# Patient Record
Sex: Male | Born: 1955 | Hispanic: No | Marital: Married | Smoking: Former smoker
Health system: Southern US, Community
[De-identification: ages and names within clinical notes are randomized; demographics above are authoritative.]

## PROBLEM LIST (undated history)

## (undated) DIAGNOSIS — Z973 Presence of spectacles and contact lenses: Secondary | ICD-10-CM

## (undated) DIAGNOSIS — K602 Anal fissure, unspecified: Secondary | ICD-10-CM

## (undated) DIAGNOSIS — J309 Allergic rhinitis, unspecified: Secondary | ICD-10-CM

## (undated) DIAGNOSIS — K219 Gastro-esophageal reflux disease without esophagitis: Secondary | ICD-10-CM

## (undated) DIAGNOSIS — M1990 Unspecified osteoarthritis, unspecified site: Secondary | ICD-10-CM

## (undated) DIAGNOSIS — Z8719 Personal history of other diseases of the digestive system: Secondary | ICD-10-CM

---

## 1995-10-16 HISTORY — PX: KNEE ARTHROSCOPY: SHX127

## 2005-07-03 ENCOUNTER — Ambulatory Visit: Payer: Self-pay

## 2006-02-04 ENCOUNTER — Ambulatory Visit: Payer: Self-pay | Admitting: Gastroenterology

## 2006-02-15 ENCOUNTER — Encounter (INDEPENDENT_AMBULATORY_CARE_PROVIDER_SITE_OTHER): Payer: Self-pay | Admitting: *Deleted

## 2006-02-15 ENCOUNTER — Ambulatory Visit: Payer: Self-pay | Admitting: Gastroenterology

## 2008-11-18 ENCOUNTER — Encounter: Payer: Self-pay | Admitting: Infectious Diseases

## 2011-01-03 ENCOUNTER — Other Ambulatory Visit: Payer: Self-pay | Admitting: Dermatology

## 2014-05-11 ENCOUNTER — Other Ambulatory Visit: Payer: Self-pay | Admitting: Dermatology

## 2015-06-30 ENCOUNTER — Inpatient Hospital Stay (HOSPITAL_COMMUNITY)
Admission: EM | Admit: 2015-06-30 | Discharge: 2015-07-08 | DRG: 854 | Disposition: A | Discharge status: Home / Self Care | Payer: BLUE CROSS/BLUE SHIELD | Attending: Surgery | Admitting: Surgery

## 2015-06-30 ENCOUNTER — Encounter (HOSPITAL_COMMUNITY): Payer: Self-pay | Admitting: Emergency Medicine

## 2015-06-30 ENCOUNTER — Observation Stay (HOSPITAL_COMMUNITY): Payer: BLUE CROSS/BLUE SHIELD

## 2015-06-30 ENCOUNTER — Ambulatory Visit (INDEPENDENT_AMBULATORY_CARE_PROVIDER_SITE_OTHER): Payer: BLUE CROSS/BLUE SHIELD | Admitting: Family Medicine

## 2015-06-30 VITALS — BP 158/80 | HR 113 | Temp 100.5°F | Resp 18 | Ht 69.5 in | Wt 191.0 lb

## 2015-06-30 DIAGNOSIS — Z791 Long term (current) use of non-steroidal anti-inflammatories (NSAID): Secondary | ICD-10-CM

## 2015-06-30 DIAGNOSIS — K611 Rectal abscess: Secondary | ICD-10-CM

## 2015-06-30 DIAGNOSIS — N289 Disorder of kidney and ureter, unspecified: Secondary | ICD-10-CM | POA: Diagnosis present

## 2015-06-30 DIAGNOSIS — R52 Pain, unspecified: Secondary | ICD-10-CM | POA: Diagnosis not present

## 2015-06-30 DIAGNOSIS — A419 Sepsis, unspecified organism: Principal | ICD-10-CM | POA: Diagnosis present

## 2015-06-30 DIAGNOSIS — Z23 Encounter for immunization: Secondary | ICD-10-CM

## 2015-06-30 DIAGNOSIS — Z8249 Family history of ischemic heart disease and other diseases of the circulatory system: Secondary | ICD-10-CM

## 2015-06-30 DIAGNOSIS — L03317 Cellulitis of buttock: Secondary | ICD-10-CM | POA: Diagnosis present

## 2015-06-30 DIAGNOSIS — Z7982 Long term (current) use of aspirin: Secondary | ICD-10-CM

## 2015-06-30 DIAGNOSIS — Z8719 Personal history of other diseases of the digestive system: Secondary | ICD-10-CM

## 2015-06-30 DIAGNOSIS — L0231 Cutaneous abscess of buttock: Secondary | ICD-10-CM

## 2015-06-30 DIAGNOSIS — Z79899 Other long term (current) drug therapy: Secondary | ICD-10-CM

## 2015-06-30 HISTORY — DX: Personal history of other diseases of the digestive system: Z87.19

## 2015-06-30 LAB — CBC WITH DIFFERENTIAL/PLATELET
BASOS ABS: 0 10*3/uL (ref 0.0–0.1)
BASOS PCT: 0 %
EOS PCT: 0 %
Eosinophils Absolute: 0.1 10*3/uL (ref 0.0–0.7)
HCT: 41 % (ref 39.0–52.0)
Hemoglobin: 15 g/dL (ref 13.0–17.0)
Lymphocytes Relative: 4 %
Lymphs Abs: 1 10*3/uL (ref 0.7–4.0)
MCH: 30.7 pg (ref 26.0–34.0)
MCHC: 36.6 g/dL — ABNORMAL HIGH (ref 30.0–36.0)
MCV: 84 fL (ref 78.0–100.0)
MONO ABS: 0.9 10*3/uL (ref 0.1–1.0)
Monocytes Relative: 4 %
NEUTROS ABS: 21.7 10*3/uL — AB (ref 1.7–7.7)
Neutrophils Relative %: 92 %
PLATELETS: 494 10*3/uL — AB (ref 150–400)
RBC: 4.88 MIL/uL (ref 4.22–5.81)
RDW: 15.4 % (ref 11.5–15.5)
WBC: 23.7 10*3/uL — AB (ref 4.0–10.5)

## 2015-06-30 LAB — BASIC METABOLIC PANEL
Anion gap: 11 (ref 5–15)
BUN: 16 mg/dL (ref 6–20)
CALCIUM: 8.5 mg/dL — AB (ref 8.9–10.3)
CHLORIDE: 102 mmol/L (ref 101–111)
CO2: 20 mmol/L — AB (ref 22–32)
CREATININE: 1.07 mg/dL (ref 0.61–1.24)
GFR calc Af Amer: >60 mL/min (ref >60)
GFR calc non Af Amer: >60 mL/min (ref >60)
GLUCOSE: 99 mg/dL (ref 65–99)
Potassium: 3.1 mmol/L — ABNORMAL LOW (ref 3.5–5.1)
Sodium: 133 mmol/L — ABNORMAL LOW (ref 135–145)

## 2015-06-30 LAB — I-STAT CG4 LACTIC ACID, ED
Lactic Acid, Venous: 1.23 mmol/L (ref 0.5–2.0)
Lactic Acid, Venous: 1.43 mmol/L (ref 0.5–2.0)

## 2015-06-30 MED ORDER — ACETAMINOPHEN 650 MG RE SUPP: 650.0000 mg | Freq: Four times a day (QID) | RECTAL | Status: DC | PRN

## 2015-06-30 MED ORDER — ONDANSETRON 4 MG PO TBDP
4.0000 mg | ORAL_TABLET | Freq: Four times a day (QID) | ORAL | Status: DC | PRN
Start: 2015-06-30 — End: 2015-07-08

## 2015-06-30 MED ORDER — ONDANSETRON HCL 4 MG/2ML IJ SOLN
4.0000 mg | Freq: Four times a day (QID) | INTRAMUSCULAR | Status: DC | PRN
  Administered 2015-07-01: 4 mg via INTRAVENOUS
  Filled 2015-06-30: qty 2

## 2015-06-30 MED ORDER — VANCOMYCIN HCL IN DEXTROSE 1-5 GM/200ML-% IV SOLN
1000.0000 mg | Freq: Two times a day (BID) | INTRAVENOUS | Status: DC
  Administered 2015-07-01 – 2015-07-02 (×3): 1000 mg via INTRAVENOUS
  Filled 2015-06-30 (×4): qty 200

## 2015-06-30 MED ORDER — KCL IN DEXTROSE-NACL 20-5-0.45 MEQ/L-%-% IV SOLN
INTRAVENOUS | Status: DC
  Administered 2015-06-30: 21:00:00 via INTRAVENOUS
  Filled 2015-06-30 (×2): qty 1000

## 2015-06-30 MED ORDER — ACETAMINOPHEN 325 MG PO TABS
650.0000 mg | ORAL_TABLET | Freq: Four times a day (QID) | ORAL | Status: DC | PRN
  Administered 2015-06-30: 650 mg via ORAL
  Filled 2015-06-30: qty 2

## 2015-06-30 MED ORDER — HYDROCODONE-ACETAMINOPHEN 5-325 MG PO TABS
1.0000 | ORAL_TABLET | ORAL | Status: DC | PRN
  Administered 2015-07-01 – 2015-07-02 (×2): 1 via ORAL
  Administered 2015-07-02: 2 via ORAL
  Administered 2015-07-03: 1 via ORAL
  Administered 2015-07-03 – 2015-07-08 (×13): 2 via ORAL
  Filled 2015-06-30 (×4): qty 2
  Filled 2015-06-30: qty 1
  Filled 2015-06-30 (×6): qty 2
  Filled 2015-06-30 (×2): qty 1
  Filled 2015-06-30 (×4): qty 2

## 2015-06-30 MED ORDER — INFLUENZA VAC SPLIT QUAD 0.5 ML IM SUSY
0.5000 mL | PREFILLED_SYRINGE | INTRAMUSCULAR | Status: AC
  Administered 2015-07-02: 0.5 mL via INTRAMUSCULAR
  Filled 2015-06-30 (×2): qty 0.5

## 2015-06-30 MED ORDER — IOHEXOL 300 MG/ML  SOLN
100.0000 mL | Freq: Once | INTRAMUSCULAR | Status: AC | PRN
  Administered 2015-06-30: 100 mL via INTRAVENOUS

## 2015-06-30 MED ORDER — HYDROMORPHONE HCL 1 MG/ML IJ SOLN
1.0000 mg | INTRAMUSCULAR | Status: DC | PRN
  Administered 2015-07-01: 1 mg via INTRAVENOUS
  Filled 2015-06-30: qty 1

## 2015-06-30 MED ORDER — VANCOMYCIN HCL IN DEXTROSE 1-5 GM/200ML-% IV SOLN
1000.0000 mg | INTRAVENOUS | Status: AC
  Administered 2015-06-30: 1000 mg via INTRAVENOUS
  Filled 2015-06-30: qty 200

## 2015-06-30 NOTE — Progress Notes (Signed)
ANTIBIOTIC CONSULT NOTE - INITIAL  Pharmacy Consult for vancomycin Indication: perirectal abscess  No Known Allergies  Patient Measurements:    Vital Signs: Temp: 99.4 F (37.4 C) (09/15 1714) Temp Source: Oral (09/15 1714) BP: 163/81 mmHg (09/15 1755) Pulse Rate: 111 (09/15 1755) Intake/Output from previous day:   Intake/Output from this shift:    Labs:  Recent Labs  06/30/15 1735 06/30/15 1828  WBC 23.7*  --   HGB 15.0  --   PLT 494*  --   CREATININE  --  1.07   Estimated Creatinine Clearance: 80.8 mL/min (by C-G formula based on Cr of 1.07). No results for input(s): VANCOTROUGH, VANCOPEAK, VANCORANDOM, GENTTROUGH, GENTPEAK, GENTRANDOM, TOBRATROUGH, TOBRAPEAK, TOBRARND, AMIKACINPEAK, AMIKACINTROU, AMIKACIN in the last 72 hours.   Microbiology: No results found for this or any previous visit (from the past 720 hour(s)).  Medical History: Past Medical History  Diagnosis Date  . Allergy     Medications:  Scheduled:  Infusions:   Assessment: 59 yo presents to ER with CC of perirectal abscess size of fist and may need a surgical consult. To start vancomycin per pharmacy consult. Fever, WBC elevated, SCr 1.07 with est CrCl 81  Goal of Therapy:  Vancomycin trough level 15-20 mcg/ml  Plan:  Vancomycin 1g IV x 1 in ER then start 1g IV q12 thereafter   Hessie Knows, PharmD, BCPS Pager [phone] 06/30/2015 7:57 PM

## 2015-06-30 NOTE — ED Notes (Signed)
Dr Blima Singer, surgery, called and spoke with Dr Adriana Simas.

## 2015-06-30 NOTE — ED Notes (Signed)
Patient c/o abscess to left buttock since Monday. No draining currently, pain 3/10. Denies fever or chills at home.

## 2015-06-30 NOTE — ED Provider Notes (Signed)
CSN: [id number]     Arrival date & time 06/30/15  1646 History   First MD Initiated Contact with Patient 06/30/15 1751     Chief Complaint  Patient presents with  . Abscess     (Consider location/radiation/quality/duration/timing/severity/associated sxs/prior Treatment) HPI....Marland Kitchen Abscess on left side of buttocks for several days. Low-grade fever. Patient initially evaluated by urgent care center.  Primary care physician spoke to Dr on call for [facility]. Patient is eating and ambulatory.  Past Medical History  Diagnosis Date  . Allergy    Past Surgical History  Procedure Laterality Date  . Knee arthroscopy Left    Family History  Problem Relation Age of Onset  . Hypertension Mother   . Hypertension Father   . Heart disease Father    Social History  Substance Use Topics  . Smoking status: Never Smoker   . Smokeless tobacco: None  . Alcohol Use: 0.0 oz/week    0 Standard drinks or equivalent per week    Review of Systems  All other systems reviewed and are negative.     Allergies  Review of patient's allergies indicates no known allergies.  Home Medications   Prior to Admission medications   Medication Sig Start Date End Date Taking? Authorizing Provider  aspirin 81 MG tablet Take 81 mg by mouth daily.   Yes Historical Provider, MD  esomeprazole (NEXIUM) 10 MG packet Take 10 mg by mouth daily as needed (acid reflux).    Yes Historical Provider, MD  ibuprofen (ADVIL,MOTRIN) 200 MG tablet Take 400 mg by mouth every 6 (six) hours as needed for moderate pain.   Yes Historical Provider, MD  meloxicam (MOBIC) 15 MG tablet Take 15 mg by mouth daily.   Yes Historical Provider, MD  pseudoephedrine (SUDAFED) 30 MG tablet Take 60 mg by mouth every 4 (four) hours as needed for congestion.   Yes Historical Provider, MD   BP 163/81 mmHg  Pulse 111  Temp(Src) 99.4 F (37.4 C) (Oral)  Resp 16  SpO2 98% Physical Exam  Constitutional: He is oriented to person,  place, and time. He appears well-developed and well-nourished.  HENT:  Head: Normocephalic and atraumatic.  Eyes: Conjunctivae and EOM are normal. Pupils are equal, round, and reactive to light.  Neck: Normal range of motion. Neck supple.  Cardiovascular: Normal rate and regular rhythm.   Pulmonary/Chest: Effort normal and breath sounds normal.  Abdominal: Soft. Bowel sounds are normal.  Genitourinary:  History of erythema on left medial buttocks approximately 10-15 cm in diameter. Area of induration medially. No obvious pus pocket.  Musculoskeletal: Normal range of motion.  Neurological: He is alert and oriented to person, place, and time.  Skin: Skin is warm and dry.  Psychiatric: He has a normal mood and affect. His behavior is normal.  Nursing note and vitals reviewed.   ED Course  Procedures (including critical care time) Labs Review Labs Reviewed  CBC WITH DIFFERENTIAL/PLATELET - Abnormal; Notable for the following:    WBC 23.7 (*)    MCHC 36.6 (*)    Platelets 494 (*)    Neutro Abs 21.7 (*)    All other components within normal limits  BASIC METABOLIC PANEL  I-STAT CG4 LACTIC ACID, ED    Imaging Review No results found. I have personally reviewed and evaluated these images and lab results as part of my medical decision-making.   EKG Interpretation None      MDM   Final diagnoses:  Perirectal abscess  Patient is febrile but nontoxic appearing. Discussed with general surgeon. He will evaluate patient. IV antibiotic started.    Donnetta Hutching, MD 07/03/15 [phone]

## 2015-06-30 NOTE — Progress Notes (Signed)
Swelling of  buttock  Subjective:  Patient ID: Joseph Villarreal, male    DOB: 07-31-1956  Age: 59 y.o. MRN: [id number]   patient is here for several things. He's been having headache congestion since  Doing yard work over the weekend. He had a mask on. He is been very congested since then and taking Sudafed.   His blood pressure is high , usually runs around 140/80 or less at home. No other cardiovascular symptoms such as dizziness or chest pains or shortness of breath    his biggest concern is he has a swelling of his left buttock which seems to be getting bigger. He wonders whether he is getting any on the right side now. He was at the beach on vacation last week. He did have a rectal fissure some years ago. Has not been running fevers.   review of systems HEENT was negative. GI  No change in bowels. GU unremarkable. Musculoskeletal, hurts in his buttock is noted. Dermatologic feels hot on the skin on his buttock. .  Anxious.     works a Office manager. Feels the need to be able to get back to work tomorrow. I told him he might not be able to be. He is married. He lives out of town but his wife is going to come get him in case he cannot drive home from the hospital.    Objective:     Generally a well-developed well-nourished very suntanned man.eyes normal. TMs normal. Throat clear. Chest clear. Heart regular without murmurs. No cervical nodes.  Abdomen unremarkable.His left buttock has a swelling about the size of my fist from the gluteal crease around the anal area on over to the mid buttock. It is tight indurated without fluctuance. It is not coming to a head. He is very tender just to the left of the anus.  Assessment & Plan:   Assessment:   left gluteal abscess   blood pressure elevation secondary to pseudoephedrine  Allergic rhinitis and congestion  Plan:  ad a seem to emergency room  Patient Instructions   Take an anti-histamine such as Allegra or Claritin or Zyrtec ( loratadine,  fexofenadine, or cetirizine) for your congestion.   Do not take anymore pseudoephedrine. I think it is causing the elevation today of the blood pressure.  Monitor your blood pressure at home. If it continues to run greater than 140/90 please return or see your primary care doctor to get reassessed   Go directly over 2 [facility] emergency room. I have spoken to the charge nurse. I also spoke to Dr. Gaynelle Adu , a surgeon from [facility] surgery, who his going to try and let his associate who is on-call for tonight know about this. The nurse said to let you know that things are busy and there may be a wait. However this must be done tonight, so head on over there. Do not eat anything in case they need to give some an aesthetic to drain this.            HOPPER,DAVID, MD 06/30/2015

## 2015-06-30 NOTE — ED Notes (Signed)
Pt being sent over by Dr Al[facility] has perirectal abscess size of fist, may need surgery consult.

## 2015-06-30 NOTE — Patient Instructions (Addendum)
Take an anti-histamine such as Allegra or Claritin or Zyrtec ( loratadine, fexofenadine, or cetirizine) for your congestion.   Do not take anymore pseudoephedrine. I think it is causing the elevation today of the blood pressure.  Monitor your blood pressure at home. If it continues to run greater than 140/90 please return or see your primary care doctor to get reassessed   Go directly over 2 [facility] emergency room. I have spoken to the charge nurse. I also spoke to Dr. Gaynelle Adu , a surgeon from [facility] surgery, who his going to try and let his associate who is on-call for tonight know about this. The nurse said to let you know that things are busy and there may be a wait. However this must be done tonight, so head on over there. Do not eat anything in case they need to give some an aesthetic to drain this.

## 2015-06-30 NOTE — H&P (Signed)
Joseph Villarreal is an 59 y.o. male.    General Surgery [facility]  Chief Complaint: cellulitis left buttock, rule out abscess  HPI: patient is a 59 yo WM with 4 day hx of pain in left buttock.  No drainage.  No pain with normal BM's.  Low grade fever with sweats, chills.  Seen [facility] at [facility] and referred to ER for evaluation.  WBC 23K.  No scans yet performed.  Past Medical History  Diagnosis Date  . Allergy     Past Surgical History  Procedure Laterality Date  . Knee arthroscopy Left     Family History  Problem Relation Age of Onset  . Hypertension Mother   . Hypertension Father   . Heart disease Father    Social History:  reports that he has never smoked. He does not have any smokeless tobacco history on file. He reports that he drinks alcohol. He reports that he does not use illicit drugs.  Allergies: No Known Allergies   (Not in a hospital admission)  Results for orders placed or performed during the hospital encounter of 06/30/15 (from the past 48 hour(s))  CBC with Differential     Status: Abnormal   Collection Time: 06/30/15  5:35 PM  Result Value Ref Range   WBC 23.7 (H) 4.0 - 10.5 K/uL   RBC 4.88 4.22 - 5.81 MIL/uL   Hemoglobin 15.0 13.0 - 17.0 g/dL   HCT 41.0 39.0 - 52.0 %   MCV 84.0 78.0 - 100.0 fL   MCH 30.7 26.0 - 34.0 pg   MCHC 36.6 (H) 30.0 - 36.0 g/dL   RDW 15.4 11.5 - 15.5 %   Platelets 494 (H) 150 - 400 K/uL   Neutrophils Relative % 92 %   Neutro Abs 21.7 (H) 1.7 - 7.7 K/uL   Lymphocytes Relative 4 %   Lymphs Abs 1.0 0.7 - 4.0 K/uL   Monocytes Relative 4 %   Monocytes Absolute 0.9 0.1 - 1.0 K/uL   Eosinophils Relative 0 %   Eosinophils Absolute 0.1 0.0 - 0.7 K/uL   Basophils Relative 0 %   Basophils Absolute 0.0 0.0 - 0.1 K/uL  I-Stat CG4 Lactic Acid, ED (Not at [facility])     Status: None   Collection Time: 06/30/15  5:42 PM  Result Value Ref Range   Lactic Acid, Venous 1.23 0.5 - 2.0 mmol/L  Basic metabolic  panel     Status: Abnormal   Collection Time: 06/30/15  6:28 PM  Result Value Ref Range   Sodium 133 (L) 135 - 145 mmol/L   Potassium 3.1 (L) 3.5 - 5.1 mmol/L   Chloride 102 101 - 111 mmol/L   CO2 20 (L) 22 - 32 mmol/L   Glucose, Bld 99 65 - 99 mg/dL   BUN 16 6 - 20 mg/dL   Creatinine, Ser 1.07 0.61 - 1.24 mg/dL   Calcium 8.5 (L) 8.9 - 10.3 mg/dL   GFR calc non Af Amer >60 >60 mL/min   GFR calc Af Amer >60 >60 mL/min    Comment: (NOTE) The eGFR has been calculated using the CKD EPI equation. This calculation has not been validated in all clinical situations. eGFR's persistently <60 mL/min signify possible Chronic Kidney Disease.    Anion gap 11 5 - 15   No results found.  Review of Systems  Constitutional: Positive for fever, chills and diaphoresis.  HENT: Negative.   Eyes: Negative.   Respiratory: Negative.  Cardiovascular: Negative.   Gastrointestinal: Negative.   Genitourinary: Negative.   Musculoskeletal: Negative.   Skin:       Pain left buttock  Neurological: Negative.   Endo/Heme/Allergies: Negative.   Psychiatric/Behavioral: Negative.     Blood pressure 163/81, pulse 111, temperature 99.4 F (37.4 C), temperature source Oral, resp. rate 16, SpO2 98 %. Physical Exam  Constitutional: He is oriented to person, place, and time. He appears well-developed and well-nourished. No distress.  HENT:  Head: Normocephalic and atraumatic.  Right Ear: External ear normal.  Left Ear: External ear normal.  Eyes: Conjunctivae are normal. Pupils are equal, round, and reactive to light. No scleral icterus.  Neck: Normal range of motion. Neck supple. No tracheal deviation present. No thyromegaly present.  Cardiovascular: Normal rate, regular rhythm and normal heart sounds.   No murmur heard. Respiratory: Effort normal and breath sounds normal. No respiratory distress. He has no wheezes.  GI: Soft. Bowel sounds are normal. He exhibits no distension. There is no tenderness.   Genitourinary:  Left buttock with induration and erythema over approx 15 cm diameter; no obvious fluctuence; no drainage; right buttock with mild tenderness; rectal deferred as performed by primary MD earlier with no acute findings  Musculoskeletal: Normal range of motion. He exhibits no edema or tenderness.  Neurological: He is alert and oriented to person, place, and time.  Skin: Skin is warm. He is diaphoretic. There is erythema (left medial buttock).  Psychiatric: He has a normal mood and affect. His behavior is normal.     Assessment/Plan Cellulitis left buttock, rule out occult abscess  Admit to surgical service  IV Vanco started in ER, will add Zosyn to cover possible rectal source  CT pelvis to rule out deep space abscess which is not evident on physical exam  Discussed plans with patient and wife at bedside.  They agree to proceed.  May require operative intervention if abscess identified.  Earnstine Regal, MD, [facility] Office: [place] 06/30/2015, 8:12 PM

## 2015-07-01 ENCOUNTER — Encounter (HOSPITAL_COMMUNITY): Payer: Self-pay

## 2015-07-01 ENCOUNTER — Observation Stay (HOSPITAL_COMMUNITY): Payer: BLUE CROSS/BLUE SHIELD | Admitting: Certified Registered Nurse Anesthetist

## 2015-07-01 ENCOUNTER — Encounter (HOSPITAL_COMMUNITY): Admission: EM | Disposition: A | Discharge status: Home / Self Care | Payer: Self-pay | Source: Home / Self Care

## 2015-07-01 DIAGNOSIS — K611 Rectal abscess: Secondary | ICD-10-CM | POA: Diagnosis present

## 2015-07-01 DIAGNOSIS — A419 Sepsis, unspecified organism: Secondary | ICD-10-CM | POA: Diagnosis present

## 2015-07-01 DIAGNOSIS — Z23 Encounter for immunization: Secondary | ICD-10-CM | POA: Diagnosis not present

## 2015-07-01 DIAGNOSIS — Z8249 Family history of ischemic heart disease and other diseases of the circulatory system: Secondary | ICD-10-CM | POA: Diagnosis not present

## 2015-07-01 DIAGNOSIS — N289 Disorder of kidney and ureter, unspecified: Secondary | ICD-10-CM | POA: Diagnosis present

## 2015-07-01 DIAGNOSIS — Z79899 Other long term (current) drug therapy: Secondary | ICD-10-CM | POA: Diagnosis not present

## 2015-07-01 DIAGNOSIS — L03317 Cellulitis of buttock: Secondary | ICD-10-CM | POA: Diagnosis present

## 2015-07-01 DIAGNOSIS — R52 Pain, unspecified: Secondary | ICD-10-CM | POA: Diagnosis present

## 2015-07-01 DIAGNOSIS — Z7982 Long term (current) use of aspirin: Secondary | ICD-10-CM | POA: Diagnosis not present

## 2015-07-01 DIAGNOSIS — Z791 Long term (current) use of non-steroidal anti-inflammatories (NSAID): Secondary | ICD-10-CM | POA: Diagnosis not present

## 2015-07-01 HISTORY — PX: INCISION AND DRAINAGE ABSCESS: SHX5864

## 2015-07-01 LAB — CBC
HCT: 39.4 % (ref 39.0–52.0)
HEMOGLOBIN: 13.6 g/dL (ref 13.0–17.0)
MCH: 30.4 pg (ref 26.0–34.0)
MCHC: 34.5 g/dL (ref 30.0–36.0)
MCV: 87.9 fL (ref 78.0–100.0)
Platelets: 300 10*3/uL (ref 150–400)
RBC: 4.48 MIL/uL (ref 4.22–5.81)
RDW: 14.8 % (ref 11.5–15.5)
WBC: 27.2 10*3/uL — ABNORMAL HIGH (ref 4.0–10.5)

## 2015-07-01 LAB — CREATININE, SERUM
Creatinine, Ser: 1.29 mg/dL — ABNORMAL HIGH (ref 0.61–1.24)
GFR calc non Af Amer: 59 mL/min — ABNORMAL LOW (ref >60)

## 2015-07-01 LAB — SURGICAL PCR SCREEN
MRSA, PCR: NEGATIVE
Staphylococcus aureus: NEGATIVE

## 2015-07-01 SURGERY — INCISION AND DRAINAGE, ABSCESS
Anesthesia: General | Site: Rectum

## 2015-07-01 MED ORDER — KCL IN DEXTROSE-NACL 20-5-0.45 MEQ/L-%-% IV SOLN
INTRAVENOUS | Status: DC
  Administered 2015-07-01 – 2015-07-02 (×2): via INTRAVENOUS
  Filled 2015-07-01 (×4): qty 1000

## 2015-07-01 MED ORDER — LIDOCAINE HCL (CARDIAC) 20 MG/ML IV SOLN
INTRAVENOUS | Status: AC
Start: 2015-07-01 — End: 2015-07-01
  Filled 2015-07-01: qty 5

## 2015-07-01 MED ORDER — HYDROGEN PEROXIDE 3 % EX SOLN
CUTANEOUS | Status: AC
  Filled 2015-07-01: qty 473

## 2015-07-01 MED ORDER — PROMETHAZINE HCL 25 MG/ML IJ SOLN: 6.2500 mg | INTRAMUSCULAR | Status: DC | PRN

## 2015-07-01 MED ORDER — FENTANYL CITRATE (PF) 100 MCG/2ML IJ SOLN
INTRAMUSCULAR | Status: DC | PRN
  Administered 2015-07-01: 100 ug via INTRAVENOUS
  Administered 2015-07-01: 50 ug via INTRAVENOUS

## 2015-07-01 MED ORDER — PANTOPRAZOLE SODIUM 40 MG PO PACK
40.0000 mg | PACK | Freq: Every day | ORAL | Status: DC | PRN
  Filled 2015-07-01: qty 20

## 2015-07-01 MED ORDER — PIPERACILLIN-TAZOBACTAM 3.375 G IVPB
INTRAVENOUS | Status: AC
  Filled 2015-07-01: qty 50

## 2015-07-01 MED ORDER — LIDOCAINE HCL (CARDIAC) 20 MG/ML IV SOLN
INTRAVENOUS | Status: DC | PRN
  Administered 2015-07-01: 100 mg via INTRAVENOUS

## 2015-07-01 MED ORDER — LACTATED RINGERS IV SOLN: INTRAVENOUS | Status: AC

## 2015-07-01 MED ORDER — ONDANSETRON HCL 4 MG/2ML IJ SOLN
INTRAMUSCULAR | Status: DC | PRN
  Administered 2015-07-01: 4 mg via INTRAVENOUS

## 2015-07-01 MED ORDER — ONDANSETRON HCL 4 MG/2ML IJ SOLN
INTRAMUSCULAR | Status: AC
  Filled 2015-07-01: qty 2

## 2015-07-01 MED ORDER — PROPOFOL 10 MG/ML IV BOLUS
INTRAVENOUS | Status: DC | PRN
  Administered 2015-07-01: 50 mg via INTRAVENOUS
  Administered 2015-07-01: 150 mg via INTRAVENOUS

## 2015-07-01 MED ORDER — ARTIFICIAL TEARS OP OINT
TOPICAL_OINTMENT | OPHTHALMIC | Status: AC
  Filled 2015-07-01: qty 3.5

## 2015-07-01 MED ORDER — CHLORHEXIDINE GLUCONATE 0.12 % MT SOLN: 15.0000 mL | Freq: Two times a day (BID) | OROMUCOSAL | Status: DC

## 2015-07-01 MED ORDER — LACTATED RINGERS IV SOLN
INTRAVENOUS | Status: DC
  Administered 2015-07-01: 1000 mL via INTRAVENOUS

## 2015-07-01 MED ORDER — PIPERACILLIN-TAZOBACTAM 3.375 G IVPB
3.3750 g | Freq: Three times a day (TID) | INTRAVENOUS | Status: DC
  Administered 2015-07-01 – 2015-07-07 (×18): 3.375 g via INTRAVENOUS
  Filled 2015-07-01 (×19): qty 50

## 2015-07-01 MED ORDER — DEXAMETHASONE SODIUM PHOSPHATE 10 MG/ML IJ SOLN
INTRAMUSCULAR | Status: DC | PRN
  Administered 2015-07-01: 10 mg via INTRAVENOUS

## 2015-07-01 MED ORDER — BUPIVACAINE-EPINEPHRINE (PF) 0.25% -1:200000 IJ SOLN
INTRAMUSCULAR | Status: AC
  Filled 2015-07-01: qty 30

## 2015-07-01 MED ORDER — PROPOFOL 10 MG/ML IV BOLUS
INTRAVENOUS | Status: AC
  Filled 2015-07-01: qty 20

## 2015-07-01 MED ORDER — PHENYLEPHRINE HCL 10 MG/ML IJ SOLN
INTRAMUSCULAR | Status: DC | PRN
  Administered 2015-07-01: 80 ug via INTRAVENOUS

## 2015-07-01 MED ORDER — FENTANYL CITRATE (PF) 100 MCG/2ML IJ SOLN
INTRAMUSCULAR | Status: AC
  Filled 2015-07-01: qty 4

## 2015-07-01 MED ORDER — MIDAZOLAM HCL 5 MG/5ML IJ SOLN
INTRAMUSCULAR | Status: DC | PRN
  Administered 2015-07-01: 2 mg via INTRAVENOUS

## 2015-07-01 MED ORDER — HEPARIN SODIUM (PORCINE) 5000 UNIT/ML IJ SOLN
5000.0000 [IU] | Freq: Three times a day (TID) | INTRAMUSCULAR | Status: DC
  Administered 2015-07-01 – 2015-07-08 (×21): 5000 [IU] via SUBCUTANEOUS
  Filled 2015-07-01 (×24): qty 1

## 2015-07-01 MED ORDER — SUCCINYLCHOLINE CHLORIDE 20 MG/ML IJ SOLN
INTRAMUSCULAR | Status: DC | PRN
  Administered 2015-07-01: 100 mg via INTRAVENOUS

## 2015-07-01 MED ORDER — MIDAZOLAM HCL 2 MG/2ML IJ SOLN
INTRAMUSCULAR | Status: AC
  Filled 2015-07-01: qty 4

## 2015-07-01 MED ORDER — CETYLPYRIDINIUM CHLORIDE 0.05 % MT LIQD: 7.0000 mL | Freq: Two times a day (BID) | OROMUCOSAL | Status: DC

## 2015-07-01 MED ORDER — ESOMEPRAZOLE MAGNESIUM 10 MG PO PACK: 10.0000 mg | PACK | Freq: Every day | ORAL | Status: DC | PRN

## 2015-07-01 MED ORDER — DEXAMETHASONE SODIUM PHOSPHATE 10 MG/ML IJ SOLN
INTRAMUSCULAR | Status: AC
  Filled 2015-07-01: qty 1

## 2015-07-01 MED ORDER — 0.9 % SODIUM CHLORIDE (POUR BTL) OPTIME
TOPICAL | Status: DC | PRN
  Administered 2015-07-01: 1000 mL

## 2015-07-01 MED ORDER — FENTANYL CITRATE (PF) 100 MCG/2ML IJ SOLN: 25.0000 ug | INTRAMUSCULAR | Status: DC | PRN

## 2015-07-01 SURGICAL SUPPLY — 29 items
BLADE SURG 15 STRL LF DISP TIS (BLADE) ×1 IMPLANT
BLADE SURG 15 STRL SS (BLADE) ×2
BNDG GAUZE ELAST 4 BULKY (GAUZE/BANDAGES/DRESSINGS) IMPLANT
COVER SURGICAL LIGHT HANDLE (MISCELLANEOUS) ×3 IMPLANT
DECANTER SPIKE VIAL GLASS SM (MISCELLANEOUS) IMPLANT
DRAPE LAPAROSCOPIC ABDOMINAL (DRAPES) ×3 IMPLANT
DRSG PAD ABDOMINAL 8X10 ST (GAUZE/BANDAGES/DRESSINGS) IMPLANT
ELECT PENCIL ROCKER SW 15FT (MISCELLANEOUS) ×3 IMPLANT
ELECT REM PT RETURN 9FT ADLT (ELECTROSURGICAL) ×3
ELECTRODE REM PT RTRN 9FT ADLT (ELECTROSURGICAL) ×1 IMPLANT
GAUZE SPONGE 4X4 12PLY STRL (GAUZE/BANDAGES/DRESSINGS) IMPLANT
GLOVE BIO SURGEON STRL SZ7.5 (GLOVE) ×3 IMPLANT
GOWN SPEC L4 XLG W/TWL (GOWN DISPOSABLE) ×3 IMPLANT
GOWN STRL REUS W/TWL LRG LVL3 (GOWN DISPOSABLE) ×6 IMPLANT
GOWN STRL REUS W/TWL XL LVL3 (GOWN DISPOSABLE) ×9 IMPLANT
KIT BASIN OR (CUSTOM PROCEDURE TRAY) ×3 IMPLANT
NEEDLE HYPO 25X1 1.5 SAFETY (NEEDLE) IMPLANT
NS IRRIG 1000ML POUR BTL (IV SOLUTION) ×3 IMPLANT
SPONGE LAP 18X18 X RAY DECT (DISPOSABLE) ×3 IMPLANT
SUT MNCRL AB 4-0 PS2 18 (SUTURE) IMPLANT
SUT VIC AB 3-0 SH 27 (SUTURE)
SUT VIC AB 3-0 SH 27XBRD (SUTURE) IMPLANT
SWAB COLLECTION DEVICE MRSA (MISCELLANEOUS) IMPLANT
SYR BULB 3OZ (MISCELLANEOUS) IMPLANT
SYR BULB IRRIGATION 50ML (SYRINGE) ×3 IMPLANT
SYR CONTROL 10ML LL (SYRINGE) ×3 IMPLANT
TOWEL OR 17X26 10 PK STRL BLUE (TOWEL DISPOSABLE) ×3 IMPLANT
TUBE ANAEROBIC SPECIMEN COL (MISCELLANEOUS) IMPLANT
YANKAUER SUCT BULB TIP NO VENT (SUCTIONS) ×3 IMPLANT

## 2015-07-01 NOTE — Anesthesia Preprocedure Evaluation (Addendum)
Anesthesia Evaluation  Patient identified by MRN, date of birth, ID band Patient awake    Reviewed: Allergy & Precautions, NPO status , Patient's Chart, lab work & pertinent test results  Airway Mallampati: II  TM Distance: >3 FB Neck ROM: Full    Dental no notable dental hx.    Pulmonary neg pulmonary ROS,    Pulmonary exam normal breath sounds clear to auscultation       Cardiovascular negative cardio ROS Normal cardiovascular exam Rhythm:Regular Rate:Normal     Neuro/Psych negative neurological ROS  negative psych ROS   GI/Hepatic Neg liver ROS,   Endo/Other  negative endocrine ROS  Renal/GU negative Renal ROS  negative genitourinary   Musculoskeletal negative musculoskeletal ROS (+)   Abdominal   Peds negative pediatric ROS (+)  Hematology negative hematology ROS (+)   Anesthesia Other Findings   Reproductive/Obstetrics negative OB ROS                             Anesthesia Physical Anesthesia Plan  ASA: II  Anesthesia Plan: General   Post-op Pain Management:    Induction: Intravenous  Airway Management Planned: Oral ETT  Additional Equipment:   Intra-op Plan:   Post-operative Plan: Extubation in OR  Informed Consent: I have reviewed the patients History and Physical, chart, labs and discussed the procedure including the risks, benefits and alternatives for the proposed anesthesia with the patient or authorized representative who has indicated his/her understanding and acceptance.   Dental advisory given  Plan Discussed with: CRNA  Anesthesia Plan Comments:        Anesthesia Quick Evaluation

## 2015-07-01 NOTE — Interval H&P Note (Signed)
History and Physical Interval Note:  07/01/2015 9:00 AM  Joseph Villarreal  has presented today for surgery, with the diagnosis of Perirectal Abscess  The various methods of treatment have been discussed with the patient and family. After consideration of risks, benefits and other options for treatment, the patient has consented to  Procedure(s): INCISION AND DRAINAGE PERIRECTAL  ABSCESS (N/A) as a surgical intervention .  The patient's history has been reviewed, patient examined, no change in status, stable for surgery.  I have reviewed the patient's chart and labs.  Questions were answered to the patient's satisfaction.     MARTIN,MATTHEW B

## 2015-07-01 NOTE — Anesthesia Postprocedure Evaluation (Signed)
  Anesthesia Post-op Note  Patient: Joseph Villarreal  Procedure(s) Performed: Procedure(s) (LRB): INCISION AND DRAINAGE PERIRECTAL  ABSCESS (N/A)  Patient Location: PACU  Anesthesia Type: General  Level of Consciousness: awake and alert   Airway and Oxygen Therapy: Patient Spontanous Breathing  Post-op Pain: mild  Post-op Assessment: Post-op Vital signs reviewed, Patient's Cardiovascular Status Stable, Respiratory Function Stable, Patent Airway and No signs of Nausea or vomiting  Last Vitals:  Filed Vitals:   07/01/15 1400  BP: 118/72  Pulse: 98  Temp: 36.9 C  Resp: 18    Post-op Vital Signs: stable   Complications: No apparent anesthesia complications

## 2015-07-01 NOTE — Anesthesia Procedure Notes (Signed)
Procedure Name: Intubation Date/Time: 07/01/2015 9:38 AM Performed by: Orest Dikes Pre-anesthesia Checklist: Patient identified, Emergency Drugs available, Suction available and Patient being monitored Patient Re-evaluated:Patient Re-evaluated prior to inductionOxygen Delivery Method: Circle System Utilized Preoxygenation: Pre-oxygenation with 100% oxygen Intubation Type: IV induction Ventilation: Mask ventilation without difficulty Laryngoscope Size: Mac and 4 Grade View: Grade I Tube type: Oral Tube size: 7.5 mm Number of attempts: 1 Airway Equipment and Method: Stylet Placement Confirmation: ETT inserted through vocal cords under direct vision,  positive ETCO2 and breath sounds checked- equal and bilateral Secured at: 21 cm Tube secured with: Tape Dental Injury: Teeth and Oropharynx as per pre-operative assessment

## 2015-07-01 NOTE — Progress Notes (Signed)
Subjective: No distress, swelling now starting on the right side also.  Dr. Daphine Deutscher has seen him and discussed going to the OR tonight.  Objective: Vital signs in last 24 hours: Temp:  [98.3 F (36.8 C)-100.5 F (38.1 C)] 99.1 F (37.3 C) (09/16 0610) Pulse Rate:  [82-116] 82 (09/16 0610) Resp:  [16-23] 18 (09/16 0610) BP: (99-173)/(59-88) 101/62 mmHg (09/16 0610) SpO2:  [96 %-100 %] 100 % (09/16 0610) Weight:  [86.637 kg (191 lb)] 86.637 kg (191 lb) (09/15 1508) Last BM Date: 06/30/15 NPO  1 stool TM 99.3 WBC up to 27.2 [id number] volume subcutaneous soft tissue air involving the posterior perineum and perirectal soft tissues. There is no rim enhancing fluid collection to suggest abscess. Findings raise concern for Fournier's gangrene. 2. Soft tissue stranding in the pelvis involving the presacral region and perivesicular space, mild urinary bladder wall thickening. Recommend correlation with urinalysis. Intake/Output from previous day: 09/15 0701 - 09/16 0700 In: 400 [I.V.:400] Out: 580 [Urine:580] Intake/Output this shift:    General appearance: alert, cooperative and no distress Resp: clear to auscultation bilaterally GI: soft, non-tender; bowel sounds normal; no masses,  no organomegaly Skin: marked swelling with some erythema left and some on the right buttocks.  Lab Results:   Recent Labs  06/30/15 1735 07/01/15 0529  WBC 23.7* 27.2*  HGB 15.0 13.6  HCT 41.0 39.4  PLT 494* 300    BMET  Recent Labs  06/30/15 1828  NA 133*  K 3.1*  CL 102  CO2 20*  GLUCOSE 99  BUN 16  CREATININE 1.07  CALCIUM 8.5*   PT/INR No results for input(s): LABPROT, INR in the last 72 hours.  No results for input(s): AST, ALT, ALKPHOS, BILITOT, PROT, ALBUMIN in the last 168 hours.   Lipase  No results found for: LIPASE   Studies/Results: Ct Pelvis W Contrast  07/01/2015   CLINICAL DATA:  Cellulitis of left buttock. Perirectal abscess on the left. Symptoms for 4  days.  EXAM: CT PELVIS WITH CONTRAST  TECHNIQUE: Multidetector CT imaging of the pelvis was performed using the standard protocol following the bolus administration of intravenous contrast.  CONTRAST:  OMNIPAQUE IOHEXOL 300 MG/ML  SOLN  COMPARISON:  None.  FINDINGS: There is a moderate volume subcutaneous soft tissue air involving the posterior perineum and perirectal soft tissues. Adjacent soft tissue stranding and soft tissue edema with trace fluid about the left lateral aspect. There is no rim enhancing fluid collection. There is perirectal soft tissue induration with soft tissue stranding in the presacral space with mild presacral thickening.  Central prostatic calcifications. There is mild urinary bladder wall thickening and perivesicular inflammatory change in the right. No free fluid in the pelvis. Small bilateral inguinal lymph nodes without pathologic adenopathy. The included pelvic bowel loops appear normal. The appendix is included and normal. There is mild atherosclerosis of the distal abdominal aorta. There are no acute or suspicious osseous abnormalities.  IMPRESSION: 1. Moderate volume subcutaneous soft tissue air involving the posterior perineum and perirectal soft tissues. There is no rim enhancing fluid collection to suggest abscess. Findings raise concern for Fournier's gangrene. 2. Soft tissue stranding in the pelvis involving the presacral region and perivesicular space, mild urinary bladder wall thickening. Recommend correlation with urinalysis.  These results were called by telephone at the time of interpretation on 07/01/2015 at 12:20 am to Dr. Darnell Level , who verbally acknowledged these results.   Electronically Signed   By: Rubye Oaks M.D.   On:  07/01/2015 00:21   Prior to Admission medications   Medication Sig Start Date End Date Taking? Authorizing Provider  aspirin 81 MG tablet Take 81 mg by mouth daily.   Yes Historical Provider, MD  esomeprazole (NEXIUM) 10 MG packet  Take 10 mg by mouth daily as needed (acid reflux).    Yes Historical Provider, MD  ibuprofen (ADVIL,MOTRIN) 200 MG tablet Take 400 mg by mouth every 6 (six) hours as needed for moderate pain.   Yes Historical Provider, MD  meloxicam (MOBIC) 15 MG tablet Take 15 mg by mouth daily.   Yes Historical Provider, MD  pseudoephedrine (SUDAFED) 30 MG tablet Take 60 mg by mouth every 4 (four) hours as needed for congestion.   Yes Historical Provider, MD    Medications: . Influenza vac split quadrivalent PF  0.5 mL Intramuscular Tomorrow-1000  . vancomycin  1,000 mg Intravenous Q12H   . dextrose 5 % and 0.45 % NaCl with KCl 20 mEq/L 75 mL/hr at 06/30/15 2046    Assessment/Plan Cellulitis of left buttocks with moderate Subcutaneous air in soft tissue posterior perineum   Antibiotics:  Vancomycin and starting Zosyn DVT:  SCD going to OR, no heparin.   Plan:  I&D perirectal abscess today by Dr. Tyrone Nine have ask pharmacy to start Zosyn also.         Joseph Villarreal,Joseph Villarreal 07/01/2015

## 2015-07-01 NOTE — Transfer of Care (Signed)
Immediate Anesthesia Transfer of Care Note  Patient: Joseph Villarreal  Procedure(s) Performed: Procedure(s): INCISION AND DRAINAGE PERIRECTAL  ABSCESS (N/A)  Patient Location: PACU  Anesthesia Type:General  Level of Consciousness:  sedated, patient cooperative and responds to stimulation  Airway & Oxygen Therapy:Patient Spontanous Breathing and Patient connected to face mask oxgen  Post-op Assessment:  Report given to PACU RN and Post -op Vital signs reviewed and stable  Post vital signs:  Reviewed and stable  Last Vitals:  Filed Vitals:   07/01/15 1030  BP:   Pulse: 107  Temp:   Resp: 13    Complications: No apparent anesthesia complications

## 2015-07-01 NOTE — Op Note (Signed)
Surgeon: Wenda Low, MD, FACS  Asst:  none  Anes:  general  Procedure: Incision and drainage of horseshoe perirectal abscess  Diagnosis: Sepsis and perirectal abscess  Complications: none  EBL:   30 cc  Drains: 2 penrose drains and packing in 3 incisions  Description of Procedure:  The patient was taken to OR 4 at [facility] .  After anesthesia was administered and the patient was prepped a timeout was performed.  With the patient in the lithotomy position I performed an EUA and found a bulging mass on the left above the levators.  I inserted an 18 gauge needle into this space and obtained pus.  I cut down on the needle and then bluntly entered spaces containing foul smelling liquid and gas.  I bluntly dissected this anteriorally and posterioraly and it crossed the midline and was dissecting the right side as well.  There were two areas on the right that I opened with radial incisions and bluntly dissected everything that seemed part of this abscess.  This did not appear to be a Fournier's gangrene but a high, horseshoe perirectal abscess.  I irrigated with H2O2 and placed penrose drains connecting the openings and tied these together on the outside.  I also packed all three hole and cavities with 1 inch iodophor gauze.  Pads and mesh panties were applied.   The patient tolerated the procedure well and was taken to the PACU in stable condition.     Matt B. Daphine Deutscher, MD, [facility], [place] [phone]

## 2015-07-01 NOTE — Progress Notes (Signed)
ANTIBIOTIC CONSULT NOTE - INITIAL  Pharmacy Consult for vancomycin and zosyn Indication: perirectal abscess  No Known Allergies  Patient Measurements:    Vital Signs: Temp: 99.1 F (37.3 C) (09/16 0610) Temp Source: Oral (09/16 0610) BP: 101/62 mmHg (09/16 0610) Pulse Rate: 82 (09/16 0610) Intake/Output from previous day: 09/15 0701 - 09/16 0700 In: 400 [I.V.:400] Out: 580 [Urine:580] Intake/Output from this shift:    Labs:  Recent Labs  06/30/15 1735 06/30/15 1828 07/01/15 0529  WBC 23.7*  --  27.2*  HGB 15.0  --  13.6  PLT 494*  --  300  CREATININE  --  1.07  --    Estimated Creatinine Clearance: 80.8 mL/min (by C-G formula based on Cr of 1.07). No results for input(s): VANCOTROUGH, VANCOPEAK, VANCORANDOM, GENTTROUGH, GENTPEAK, GENTRANDOM, TOBRATROUGH, TOBRAPEAK, TOBRARND, AMIKACINPEAK, AMIKACINTROU, AMIKACIN in the last 72 hours.   Microbiology: No results found for this or any previous visit (from the past 720 hour(s)).  Medical History: Past Medical History  Diagnosis Date  . Allergy       Assessment: 59 yo presents to ER with CC of perirectal abscess size of fist and may need a surgical consult. Fever, WBC elevated, SCr 1.07 with est CrCl 81.  Vanc started on 9/15, pharmacy consulted today to add zosyn.  Goal of Therapy:  Vancomycin trough level 15-20 mcg/ml  Plan:  Zosyn 3.375g IV Q8H infused over 4hrs. Continue vancomycin 1g  IV q12  Follow renal function, cultures, vanc trough at steady state  Arley Phenix RPh 07/01/2015, 7:55 AM Pager [phone]

## 2015-07-02 LAB — BASIC METABOLIC PANEL
Anion gap: 6 (ref 5–15)
BUN: 27 mg/dL — ABNORMAL HIGH (ref 6–20)
CHLORIDE: 104 mmol/L (ref 101–111)
CO2: 27 mmol/L (ref 22–32)
CREATININE: 1.42 mg/dL — AB (ref 0.61–1.24)
Calcium: 8.6 mg/dL — ABNORMAL LOW (ref 8.9–10.3)
GFR calc non Af Amer: 52 mL/min — ABNORMAL LOW (ref >60)
Glucose, Bld: 171 mg/dL — ABNORMAL HIGH (ref 65–99)
POTASSIUM: 4.4 mmol/L (ref 3.5–5.1)
SODIUM: 137 mmol/L (ref 135–145)

## 2015-07-02 LAB — CBC
HEMATOCRIT: 35.9 % — AB (ref 39.0–52.0)
HEMOGLOBIN: 12 g/dL — AB (ref 13.0–17.0)
MCH: 28.8 pg (ref 26.0–34.0)
MCHC: 33.4 g/dL (ref 30.0–36.0)
MCV: 86.3 fL (ref 78.0–100.0)
Platelets: 297 10*3/uL (ref 150–400)
RBC: 4.16 MIL/uL — AB (ref 4.22–5.81)
RDW: 15.1 % (ref 11.5–15.5)
WBC: 27.2 10*3/uL — ABNORMAL HIGH (ref 4.0–10.5)

## 2015-07-02 LAB — CREATININE, SERUM
Creatinine, Ser: 1.24 mg/dL (ref 0.61–1.24)
GFR calc non Af Amer: >60 mL/min (ref >60)

## 2015-07-02 LAB — VANCOMYCIN, TROUGH: Vancomycin Tr: 12 ug/mL (ref 10.0–20.0)

## 2015-07-02 MED ORDER — VANCOMYCIN HCL IN DEXTROSE 1-5 GM/200ML-% IV SOLN
1000.0000 mg | Freq: Two times a day (BID) | INTRAVENOUS | Status: DC
Start: 2015-07-02 — End: 2015-07-04
  Administered 2015-07-02 – 2015-07-04 (×4): 1000 mg via INTRAVENOUS
  Filled 2015-07-02 (×4): qty 200

## 2015-07-02 MED ORDER — LIP MEDEX EX OINT
TOPICAL_OINTMENT | CUTANEOUS | Status: AC
  Administered 2015-07-02: 12:00:00
  Filled 2015-07-02: qty 7

## 2015-07-02 MED ORDER — POLYETHYLENE GLYCOL 3350 17 G PO PACK
17.0000 g | PACK | ORAL | Status: DC
  Administered 2015-07-02 – 2015-07-07 (×6): 17 g via ORAL
  Filled 2015-07-02 (×5): qty 1

## 2015-07-02 MED ORDER — POLYETHYLENE GLYCOL 3350 17 G PO PACK: 17.0000 g | PACK | Freq: Every day | ORAL | Status: DC

## 2015-07-02 NOTE — Progress Notes (Signed)
Pharmacy Consult Note - Vancomycin Follow Up  Labs: vanc trough 12, SCr 1.24 CrCl 70  A/P: Vanc trough slightly subtherapeutic (goal 15-20 for abscess but can possibly lower goal to 10-15 based on culture results) on dose 1g q12. Continue vanc dose at 1g q12 for now and will recheck trough prn  Hessie Knows, PharmD, BCPS Pager [phone] 07/02/2015 5:56 PM

## 2015-07-02 NOTE — Progress Notes (Signed)
Progress Note: General Surgery Service   Subjective: Pain controlled, had BM this morning. No nausea  Objective: Vital signs in last 24 hours: Temp:  [98.2 F (36.8 C)-99.2 F (37.3 C)] 98.2 F (36.8 C) (09/17 0555) Pulse Rate:  [65-107] 73 (09/17 0555) Resp:  [13-20] 18 (09/17 0555) BP: (105-119)/(51-72) 114/70 mmHg (09/17 0555) SpO2:  [94 %-100 %] 99 % (09/17 0555) Last BM Date: 07/01/15  Intake/Output from previous day: 09/16 0701 - 09/17 0700 In: 3395.5 [P.O.:1448; I.V.:1697.5; IV Piggyback:250] Out: -  Intake/Output this shift:    Lungs: CTAB  Abd: soft, NT, ND  Extremities: no edema  Neuro: aox4  Buttock: 2 penrose drains in place iodoform with purulent fluid, minimal induration on right side. -repacked at bedside  Lab Results: CBC   Recent Labs  07/01/15 0529 07/02/15 0545  WBC 27.2* 27.2*  HGB 13.6 12.0*  HCT 39.4 35.9*  PLT 300 297   BMET  Recent Labs  06/30/15 1828 07/01/15 0529 07/02/15 0545  NA 133*  --  137  K 3.1*  --  4.4  CL 102  --  104  CO2 20*  --  27  GLUCOSE 99  --  171*  BUN 16  --  27*  CREATININE 1.07 1.29* 1.42*  CALCIUM 8.5*  --  8.6*   PT/INR No results for input(s): LABPROT, INR in the last 72 hours. ABG No results for input(s): PHART, HCO3 in the last 72 hours.  Invalid input(s): PCO2, PO2  Studies/Results: Ct Pelvis W Contrast  07/01/2015   CLINICAL DATA:  Cellulitis of left buttock. Perirectal abscess on the left. Symptoms for 4 days.  EXAM: CT PELVIS WITH CONTRAST  TECHNIQUE: Multidetector CT imaging of the pelvis was performed using the standard protocol following the bolus administration of intravenous contrast.  CONTRAST:  OMNIPAQUE IOHEXOL 300 MG/ML  SOLN  COMPARISON:  None.  FINDINGS: There is a moderate volume subcutaneous soft tissue air involving the posterior perineum and perirectal soft tissues. Adjacent soft tissue stranding and soft tissue edema with trace fluid about the left lateral aspect.  There is no rim enhancing fluid collection. There is perirectal soft tissue induration with soft tissue stranding in the presacral space with mild presacral thickening.  Central prostatic calcifications. There is mild urinary bladder wall thickening and perivesicular inflammatory change in the right. No free fluid in the pelvis. Small bilateral inguinal lymph nodes without pathologic adenopathy. The included pelvic bowel loops appear normal. The appendix is included and normal. There is mild atherosclerosis of the distal abdominal aorta. There are no acute or suspicious osseous abnormalities.  IMPRESSION: 1. Moderate volume subcutaneous soft tissue air involving the posterior perineum and perirectal soft tissues. There is no rim enhancing fluid collection to suggest abscess. Findings raise concern for Fournier's gangrene. 2. Soft tissue stranding in the pelvis involving the presacral region and perivesicular space, mild urinary bladder wall thickening. Recommend correlation with urinalysis.  These results were called by telephone at the time of interpretation on 07/01/2015 at 12:20 am to Dr. Darnell Level , who verbally acknowledged these results.   Electronically Signed   By: Rubye Oaks M.D.   On: 07/01/2015 00:21    Anti-infectives: Anti-infectives    Start     Dose/Rate Route Frequency Ordered Stop   07/01/15 0800  piperacillin-tazobactam (ZOSYN) IVPB 3.375 g     3.375 g 12.5 mL/hr over 240 Minutes Intravenous Every 8 hours 07/01/15 0751     07/01/15 0600  vancomycin (VANCOCIN) IVPB  1000 mg/200 mL premix     1,000 mg 200 mL/hr over 60 Minutes Intravenous Every 12 hours 06/30/15 2002     06/30/15 1830  vancomycin (VANCOCIN) IVPB 1000 mg/200 mL premix     1,000 mg 200 mL/hr over 60 Minutes Intravenous STAT 06/30/15 1816 06/30/15 2035      Medicaions: Scheduled Meds: . heparin  5,000 Units Subcutaneous 3 times per day  . Influenza vac split quadrivalent PF  0.5 mL Intramuscular Tomorrow-1000   . piperacillin-tazobactam (ZOSYN)  IV  3.375 g Intravenous Q8H  . vancomycin  1,000 mg Intravenous Q12H   Continuous Infusions: . dextrose 5 % and 0.45 % NaCl with KCl 20 mEq/L 75 mL/hr at 07/01/15 1208  . lactated ringers     PRN Meds:.acetaminophen **OR** acetaminophen, fentaNYL (SUBLIMAZE) injection, HYDROcodone-acetaminophen, HYDROmorphone (DILAUDID) injection, ondansetron **OR** ondansetron (ZOFRAN) IV, pantoprazole sodium, promethazine  Assessment/Plan: Patient Active Problem List   Diagnosis Date Noted  . Perirectal abscess-complex 07/01/2015   s/p Procedure(s): INCISION AND DRAINAGE PERIRECTAL  ABSCESS -continue packing daily -continue abx -would want wbc to trend down prior to discharge/PO medications -add miralax  LOS: 1 day   Rodman Pickle, MD Pg# [phone] [facility]

## 2015-07-02 NOTE — Progress Notes (Signed)
ANTIBIOTIC CONSULT NOTE - FOLLOW UP  Pharmacy Consult for Vancomycin, Zosyn Indication: perirectal abscess  No Known Allergies  Patient Measurements: Height: 5' 9.5" (176.5 cm) Weight: 191 lb (86.637 kg) IBW/kg (Calculated) : 71.85  Vital Signs: Temp: 98.2 F (36.8 C) (09/17 0555) Temp Source: Oral (09/17 0555) BP: 114/70 mmHg (09/17 0555) Pulse Rate: 73 (09/17 0555) Intake/Output from previous day: 09/16 0701 - 09/17 0700 In: 3395.5 [P.O.:1448; I.V.:1697.5; IV Piggyback:250] Out: -   Labs:  Recent Labs  06/30/15 1735 06/30/15 1828 07/01/15 0529 07/02/15 0545  WBC 23.7*  --  27.2* 27.2*  HGB 15.0  --  13.6 12.0*  PLT 494*  --  300 297  CREATININE  --  1.07 1.29* 1.42*   Estimated Creatinine Clearance: 60.9 mL/min (by C-G formula based on Cr of 1.42). No results for input(s): VANCOTROUGH, VANCOPEAK, VANCORANDOM, GENTTROUGH, GENTPEAK, GENTRANDOM, TOBRATROUGH, TOBRAPEAK, TOBRARND, AMIKACINPEAK, AMIKACINTROU, AMIKACIN in the last 72 hours.    Assessment: 50 yoM [facility] and sent to ED on 9/15 for evaluation of left buttock pain and cellulitis, r/o abscess.  CT shows moderate volume subcutaneous soft tissue air involving the posterior perineum and perirectal soft tissues.  He was taken for I&D of abscess (large horseshoe perirectal abscess) and drain placement on 9/16.  Pharmacy is consulted to dose Vancomycin and Zosyn.  Today, 07/02/2015:  Tm 99.2  WBC elevated at 27  SCr increasing to 1.42 with CrCl ~ 61 ml/min  Vancomycin trough level pending.  Abscess cultures in process.  Goal of Therapy:  Vancomycin trough level 15-20 mcg/ml Appropriate abx dosing, eradication of infection.   Plan:   Continue Zosyn 3.375g IV Q8H infused over 4hrs.   Continue Vancomycin 1g IV q12h.  Measure Vanc trough at steady state - today prior to 1800 dose.  Follow up renal fxn, culture results, and clinical course.   Lynann Beaver PharmD, BCPS Pager  [phone] 07/02/2015 2:05 PM

## 2015-07-03 LAB — CBC
HCT: 35.6 % — ABNORMAL LOW (ref 39.0–52.0)
HEMOGLOBIN: 12.1 g/dL — AB (ref 13.0–17.0)
MCH: 29.4 pg (ref 26.0–34.0)
MCHC: 34 g/dL (ref 30.0–36.0)
MCV: 86.6 fL (ref 78.0–100.0)
Platelets: 336 10*3/uL (ref 150–400)
RBC: 4.11 MIL/uL — ABNORMAL LOW (ref 4.22–5.81)
RDW: 15.5 % (ref 11.5–15.5)
WBC: 27.5 10*3/uL — ABNORMAL HIGH (ref 4.0–10.5)

## 2015-07-03 LAB — BASIC METABOLIC PANEL
Anion gap: 8 (ref 5–15)
BUN: 34 mg/dL — ABNORMAL HIGH (ref 6–20)
CALCIUM: 8.3 mg/dL — AB (ref 8.9–10.3)
CHLORIDE: 107 mmol/L (ref 101–111)
CO2: 24 mmol/L (ref 22–32)
Creatinine, Ser: 1.33 mg/dL — ABNORMAL HIGH (ref 0.61–1.24)
GFR, EST NON AFRICAN AMERICAN: 57 mL/min — AB (ref >60)
Glucose, Bld: 107 mg/dL — ABNORMAL HIGH (ref 65–99)
Potassium: 3.7 mmol/L (ref 3.5–5.1)
SODIUM: 139 mmol/L (ref 135–145)

## 2015-07-03 MED ORDER — POLYETHYLENE GLYCOL 3350 17 G PO PACK
17.0000 g | PACK | Freq: Every day | ORAL | Status: DC
Start: 2015-07-04 — End: 2015-07-03

## 2015-07-03 NOTE — Progress Notes (Signed)
2 Days Post-Op  Subjective: Feels better - much less tenderness Has had two formed bowel movements Afebrile  Objective: Vital signs in last 24 hours: Temp:  [97.7 F (36.5 C)-98 F (36.7 C)] 98 F (36.7 C) (09/18 0549) Pulse Rate:  [60-66] 61 (09/18 0549) Resp:  [16-18] 16 (09/18 0549) BP: (107-113)/(63-73) 113/73 mmHg (09/18 0549) SpO2:  [97 %-100 %] 100 % (09/18 0549) Last BM Date: 07/02/15  Intake/Output from previous day: 09/17 0701 - 09/18 0700 In: 1750 [I.V.:1500; IV Piggyback:250] Out: -  Intake/Output this shift:    General appearance: alert, cooperative and no distress Perineum - no erythema; minimal induration around Penrose drains Purulent drainage - from all incisions Lab Results:   Recent Labs  07/02/15 0545 07/03/15 0555  WBC 27.2* 27.5*  HGB 12.0* 12.1*  HCT 35.9* 35.6*  PLT 297 336   BMET  Recent Labs  07/02/15 0545 07/02/15 1650 07/03/15 0555  NA 137  --  139  K 4.4  --  3.7  CL 104  --  107  CO2 27  --  24  GLUCOSE 171*  --  107*  BUN 27*  --  34*  CREATININE 1.42* 1.24 1.33*  CALCIUM 8.6*  --  8.3*   PT/INR No results for input(s): LABPROT, INR in the last 72 hours. ABG No results for input(s): PHART, HCO3 in the last 72 hours.  Invalid input(s): PCO2, PO2  Studies/Results: No results found.  Anti-infectives: Anti-infectives    Start     Dose/Rate Route Frequency Ordered Stop   07/02/15 2000  vancomycin (VANCOCIN) IVPB 1000 mg/200 mL premix     1,000 mg 200 mL/hr over 60 Minutes Intravenous Every 12 hours 07/02/15 1757     07/01/15 0800  piperacillin-tazobactam (ZOSYN) IVPB 3.375 g     3.375 g 12.5 mL/hr over 240 Minutes Intravenous Every 8 hours 07/01/15 0751     07/01/15 0600  vancomycin (VANCOCIN) IVPB 1000 mg/200 mL premix  Status:  Discontinued     1,000 mg 200 mL/hr over 60 Minutes Intravenous Every 12 hours 06/30/15 2002 07/02/15 1413   06/30/15 1830  vancomycin (VANCOCIN) IVPB 1000 mg/200 mL premix     1,000  mg 200 mL/hr over 60 Minutes Intravenous STAT 06/30/15 1816 06/30/15 2035      Assessment/Plan: s/p Procedure(s): INCISION AND DRAINAGE PERIRECTAL  ABSCESS (N/A) Persistently high WBC is still concerning, but patient looks better clinically  Recheck in AM; if still high, consider repeat CT scan to look for undrained abscess component  LOS: 2 days    TSUEI,MATTHEW K. 07/03/2015

## 2015-07-04 LAB — CBC
HEMATOCRIT: 36.9 % — AB (ref 39.0–52.0)
Hemoglobin: 12.5 g/dL — ABNORMAL LOW (ref 13.0–17.0)
MCH: 29.4 pg (ref 26.0–34.0)
MCHC: 33.9 g/dL (ref 30.0–36.0)
MCV: 86.8 fL (ref 78.0–100.0)
Platelets: 325 10*3/uL (ref 150–400)
RBC: 4.25 MIL/uL (ref 4.22–5.81)
RDW: 15.7 % — AB (ref 11.5–15.5)
WBC: 17.4 10*3/uL — AB (ref 4.0–10.5)

## 2015-07-04 LAB — CULTURE, ROUTINE-ABSCESS

## 2015-07-04 LAB — BASIC METABOLIC PANEL
ANION GAP: 6 (ref 5–15)
BUN: 31 mg/dL — AB (ref 6–20)
CALCIUM: 8 mg/dL — AB (ref 8.9–10.3)
CO2: 25 mmol/L (ref 22–32)
Chloride: 106 mmol/L (ref 101–111)
Creatinine, Ser: 1.49 mg/dL — ABNORMAL HIGH (ref 0.61–1.24)
GFR calc Af Amer: 57 mL/min — ABNORMAL LOW (ref >60)
GFR calc non Af Amer: 49 mL/min — ABNORMAL LOW (ref >60)
GLUCOSE: 87 mg/dL (ref 65–99)
POTASSIUM: 4 mmol/L (ref 3.5–5.1)
Sodium: 137 mmol/L (ref 135–145)

## 2015-07-04 LAB — VANCOMYCIN, TROUGH: Vancomycin Tr: 14 ug/mL (ref 10.0–20.0)

## 2015-07-04 MED ORDER — VANCOMYCIN HCL IN DEXTROSE 1-5 GM/200ML-% IV SOLN
1000.0000 mg | Freq: Two times a day (BID) | INTRAVENOUS | Status: DC
  Administered 2015-07-04 – 2015-07-07 (×6): 1000 mg via INTRAVENOUS
  Filled 2015-07-04 (×6): qty 200

## 2015-07-04 NOTE — Progress Notes (Signed)
[facility] Progress Note  3 Days Post-Op  Subjective: Pt is a 59 y/o male POD#3 after I&D of perirectal abscess. Today the pt states that his pain is limited to his rectum and is exacerbated with sitting, moving, and BMs. He states that it is tolerable and is well-controlled with oral pain medication. He is unable to rate his pain. He is having flatus and BMs. He denies fever, abd pain, nausea and vomiting. He is urinating without difficulty. He is tolerating a regular diet. He has been walking 1 hour per day.  Objective: Vital signs in last 24 hours: Temp:  [98.1 F (36.7 C)-98.8 F (37.1 C)] 98.7 F (37.1 C) (09/19 0600) Pulse Rate:  [57-59] 58 (09/19 0600) Resp:  [16-18] 17 (09/19 0600) BP: (115-140)/(70-81) 115/70 mmHg (09/19 0600) SpO2:  [98 %-100 %] 100 % (09/19 0600) Last BM Date: 07/04/15  Intake/Output from previous day: 09/18 0701 - 09/19 0700 In: 540 [P.O.:240; IV Piggyback:300] Out: -  Intake/Output this shift: Total I/O In: 240 [P.O.:240] Out: 0   PE: Gen:  Alert, NAD, pleasant Card:  RRR, no M/G/R heard, radial pulses 2+ BL Pulm:  CTA, no W/R/R Abd: Soft, NT/ND, +BS Skin: I&D site around the rectum is clean and non-erythematous with mild tenderness and induration. There are 3 incision sites with drains in place - 2 on the right side of the gluteal fold, 1 on the left. His dressing has a mild-moderate amount of serous, odorless drainage. Ext:  No erythema, edema, or tenderness  Lab Results:   Recent Labs  07/03/15 0555 07/04/15 0548  WBC 27.5* 17.4*  HGB 12.1* 12.5*  HCT 35.6* 36.9*  PLT 336 325   BMET  Recent Labs  07/03/15 0555 07/04/15 0548  NA 139 137  K 3.7 4.0  CL 107 106  CO2 24 25  GLUCOSE 107* 87  BUN 34* 31*  CREATININE 1.33* 1.49*  CALCIUM 8.3* 8.0*   PT/INR No results for input(s): LABPROT, INR in the last 72 hours. CMP     Component Value Date/Time   NA 137 07/04/2015 0548   K 4.0 07/04/2015 0548   CL 106  07/04/2015 0548   CO2 25 07/04/2015 0548   GLUCOSE 87 07/04/2015 0548   BUN 31* 07/04/2015 0548   CREATININE 1.49* 07/04/2015 0548   CALCIUM 8.0* 07/04/2015 0548   GFRNONAA 49* 07/04/2015 0548   GFRAA 57* 07/04/2015 0548   Lipase  No results found for: LIPASE     Studies/Results: No results found.  Anti-infectives: Anti-infectives    Start     Dose/Rate Route Frequency Ordered Stop   07/02/15 2000  vancomycin (VANCOCIN) IVPB 1000 mg/200 mL premix     1,000 mg 200 mL/hr over 60 Minutes Intravenous Every 12 hours 07/02/15 1757     07/01/15 0800  piperacillin-tazobactam (ZOSYN) IVPB 3.375 g     3.375 g 12.5 mL/hr over 240 Minutes Intravenous Every 8 hours 07/01/15 0751     07/01/15 0600  vancomycin (VANCOCIN) IVPB 1000 mg/200 mL premix  Status:  Discontinued     1,000 mg 200 mL/hr over 60 Minutes Intravenous Every 12 hours 06/30/15 2002 07/02/15 1413   06/30/15 1830  vancomycin (VANCOCIN) IVPB 1000 mg/200 mL premix     1,000 mg 200 mL/hr over 60 Minutes Intravenous STAT 06/30/15 1816 06/30/15 2035       Assessment/Plan S/p Incision and Drainage perirectal abscess - POD#3 - WBC 17.2, trending down from 27.5 yesterday. Repeat CBC in AM. -  Continue antibiotic treatment with Vancomycin (day #3) and Zosyn (day#3) - continue PO pain and nausea medications PRN  - continue miralax - sitz baths/showers PRN - dry dressing changes PRN - ambulate as tolerated    LOS: 3 days    Bobbye Riggs 07/04/2015, 10:17 AM Pager: [phone]

## 2015-07-04 NOTE — Progress Notes (Signed)
ANTIBIOTIC CONSULT NOTE - FOLLOW UP  Pharmacy Consult for Vancomycin Indication: perirectal abscess  Please see previous note from Elie Goody, PharmD for full details.  Vancomycin trough = 14 mcg/ml which is almost therapeutic (goal 15-20 mcg/ml). Given rising SCr, will expect some accumulation.  Plan:  Continue Vancomycin 1g IV q12h  Monitor SCr closely and check levels as needed  MD: consider narrowing spectrum based on culture results (Group C Streptococcus)

## 2015-07-04 NOTE — Progress Notes (Signed)
ANTIBIOTIC CONSULT NOTE - FOLLOW UP  Pharmacy Consult for Vancomycin, Zosyn Indication: perirectal abscess  No Known Allergies  Patient Measurements: Height: 5' 9.5" (176.5 cm) Weight: 191 lb (86.637 kg) IBW/kg (Calculated) : 71.85  Vital Signs: Temp: 98.7 F (37.1 C) (09/19 0600) Temp Source: Oral (09/19 0600) BP: 115/70 mmHg (09/19 0600) Pulse Rate: 58 (09/19 0600) Intake/Output from previous day: 09/18 0701 - 09/19 0700 In: 540 [P.O.:240; IV Piggyback:300] Out: -   Labs:  Recent Labs  07/02/15 0545 07/02/15 1650 07/03/15 0555 07/04/15 0548  WBC 27.2*  --  27.5* 17.4*  HGB 12.0*  --  12.1* 12.5*  PLT 297  --  336 325  CREATININE 1.42* 1.24 1.33* 1.49*   Estimated Creatinine Clearance: 58 mL/min (by C-G formula based on Cr of 1.49).  Recent Labs  07/02/15 1650  VANCOTROUGH 12      Assessment: 59 yoM initially seen at [facility] and sent to ED on 9/15 for evaluation of left buttock pain and cellulitis, r/o abscess.  CT shows moderate volume subcutaneous soft tissue air involving the posterior perineum and perirectal soft tissues.  He was taken for I&D of abscess (large horseshoe perirectal abscess) and drain placement on 9/16.  Pharmacy is consulted to dose Vancomycin and Zosyn.  Today, 07/04/2015:  Afebrile.  WBC elevated but improving.  Abscess culture remains in process  SCr slowly rising.  Today's 0800 vancomycin dose already administered.  Goal of Therapy:  Vancomycin trough level 15-20 mcg/ml Appropriate abx dosing, eradication of infection.   Plan:   Check vancomycin trough tonight at 1900; hold tonight's 2000 vancomycin dose until pharmacy has reviewed result.  Continue Zosyn 3.375g IV Q8H infused over 4hrs.   F/U on culture result when available.  Elie Goody, PharmD, BCPS Pager: [phone] 07/04/2015  9:50 AM

## 2015-07-05 ENCOUNTER — Inpatient Hospital Stay (HOSPITAL_COMMUNITY): Payer: BLUE CROSS/BLUE SHIELD

## 2015-07-05 LAB — CBC
HEMATOCRIT: 39.6 % (ref 39.0–52.0)
HEMOGLOBIN: 13.6 g/dL (ref 13.0–17.0)
MCH: 30 pg (ref 26.0–34.0)
MCHC: 34.3 g/dL (ref 30.0–36.0)
MCV: 87.2 fL (ref 78.0–100.0)
Platelets: 354 10*3/uL (ref 150–400)
RBC: 4.54 MIL/uL (ref 4.22–5.81)
RDW: 15.5 % (ref 11.5–15.5)
WBC: 20.8 10*3/uL — AB (ref 4.0–10.5)

## 2015-07-05 LAB — CREATININE, SERUM
Creatinine, Ser: 1.15 mg/dL (ref 0.61–1.24)
GFR calc non Af Amer: >60 mL/min (ref >60)

## 2015-07-05 MED ORDER — IOHEXOL 300 MG/ML  SOLN
25.0000 mL | Freq: Once | INTRAMUSCULAR | Status: DC | PRN
  Administered 2015-07-05: 25 mL via ORAL
  Filled 2015-07-05: qty 30

## 2015-07-05 MED ORDER — IOHEXOL 300 MG/ML  SOLN
100.0000 mL | Freq: Once | INTRAMUSCULAR | Status: AC | PRN
  Administered 2015-07-05: 100 mL via INTRAVENOUS

## 2015-07-05 NOTE — Progress Notes (Signed)
[facility] Progress Note  4 Days Post-Op  Subjective: Pt POD#4 after I&D of perirectal abscess. He was seen and examined while standing in his room. His wife is in the room. Pt reports chills and night sweats last night and early this morning. States his pain is similar to yesterday and rates it at a 1/10 while standing still. Pain is exacerbated with sitting, prolonged movement, and BMs. It is non-radiating and limited to the rectum. Pt denies N/V, constipation, dysuria, hematuria, and trouble ambulating. Pt reports a moderate amount of purulent drainage coming from his penrose drains. He says the drainage has turned more sanguinous in the past 24 hours.   Pt has been very concerned with keeping the area clean. He requests help with every sitz bath/shower after he has a BM because he feels like he can't get the area clean on his own.  Objective: Vital signs in last 24 hours: Temp:  [97.7 F (36.5 C)-99 F (37.2 C)] 97.7 F (36.5 C) (09/20 0547) Pulse Rate:  [58-69] 61 (09/20 0547) Resp:  [18] 18 (09/20 0547) BP: (136-158)/(64-72) 140/64 mmHg (09/20 0547) SpO2:  [95 %-99 %] 99 % (09/20 0547) Last BM Date: 07/05/15  Intake/Output from previous day: 09/19 0701 - 09/20 0700 In: 1330 [P.O.:1080; IV Piggyback:250] Out: 0  Intake/Output this shift:   PE: Gen:  Alert, mildly distressed, pleasant Card:  RRR, no M/G/R heard Pulm:  CTA, no W/R/R Abd: Soft, NT/ND, +BS  Lab Results:   Recent Labs  07/04/15 0548 07/05/15 0539  WBC 17.4* 20.8*  HGB 12.5* 13.6  HCT 36.9* 39.6  PLT 325 354   BMET  Recent Labs  07/03/15 0555 07/04/15 0548 07/05/15 0539  NA 139 137  --   K 3.7 4.0  --   CL 107 106  --   CO2 24 25  --   GLUCOSE 107* 87  --   BUN 34* 31*  --   CREATININE 1.33* 1.49* 1.15  CALCIUM 8.3* 8.0*  --    PT/INR No results for input(s): LABPROT, INR in the last 72 hours. CMP     Component Value Date/Time   NA 137 07/04/2015 0548   K 4.0 07/04/2015  0548   CL 106 07/04/2015 0548   CO2 25 07/04/2015 0548   GLUCOSE 87 07/04/2015 0548   BUN 31* 07/04/2015 0548   CREATININE 1.15 07/05/2015 0539   CALCIUM 8.0* 07/04/2015 0548   GFRNONAA >60 07/05/2015 0539   GFRAA >60 07/05/2015 0539   Lipase  No results found for: LIPASE   Studies/Results: No results found.  Anti-infectives: Anti-infectives    Start     Dose/Rate Route Frequency Ordered Stop   07/04/15 2000  vancomycin (VANCOCIN) IVPB 1000 mg/200 mL premix     1,000 mg 200 mL/hr over 60 Minutes Intravenous Every 12 hours 07/04/15 1949     07/02/15 2000  vancomycin (VANCOCIN) IVPB 1000 mg/200 mL premix  Status:  Discontinued     1,000 mg 200 mL/hr over 60 Minutes Intravenous Every 12 hours 07/02/15 1757 07/04/15 1207   07/01/15 0800  piperacillin-tazobactam (ZOSYN) IVPB 3.375 g     3.375 g 12.5 mL/hr over 240 Minutes Intravenous Every 8 hours 07/01/15 0751     07/01/15 0600  vancomycin (VANCOCIN) IVPB 1000 mg/200 mL premix  Status:  Discontinued     1,000 mg 200 mL/hr over 60 Minutes Intravenous Every 12 hours 06/30/15 2002 07/02/15 1413   06/30/15 1830  vancomycin (VANCOCIN) IVPB 1000 mg/200  mL premix     1,000 mg 200 mL/hr over 60 Minutes Intravenous STAT 06/30/15 1816 06/30/15 2035       Assessment/Plan  S/p Incision and Drainage perirectal abscess - POD#3 - WBC 20.8, trending up from 17.2 yesterday. Repeat CBC in AM.  - start clear liquids  - repeat CT pelvis ordered, concern for retained abscess in the perirectum. - Continue antibiotic treatment with Vancomycin (day #4) and Zosyn (day#4) - continue PO pain and nausea medications PRN  - continue miralax - sitz baths/showers PRN - dry dressing changes PRN - ambulate as tolerated    LOS: 4 days    Joseph Villarreal 07/05/2015, 8:53 AM Pager: [phone]

## 2015-07-06 LAB — CBC
HEMATOCRIT: 38.3 % — AB (ref 39.0–52.0)
HEMOGLOBIN: 12.9 g/dL — AB (ref 13.0–17.0)
MCH: 29.5 pg (ref 26.0–34.0)
MCHC: 33.7 g/dL (ref 30.0–36.0)
MCV: 87.4 fL (ref 78.0–100.0)
Platelets: 314 10*3/uL (ref 150–400)
RBC: 4.38 MIL/uL (ref 4.22–5.81)
RDW: 15.5 % (ref 11.5–15.5)
WBC: 17.3 10*3/uL — AB (ref 4.0–10.5)

## 2015-07-06 LAB — ANAEROBIC CULTURE

## 2015-07-06 LAB — CREATININE, SERUM: Creatinine, Ser: 1.18 mg/dL (ref 0.61–1.24)

## 2015-07-06 NOTE — Progress Notes (Signed)
  Progress Note: General Surgery Service   Subjective: Feels better today, continues to have large amount of drainage. Tolerating liquids  Objective: Vital signs in last 24 hours: Temp:  [97.9 F (36.6 C)-99.2 F (37.3 C)] 97.9 F (36.6 C) (09/21 0730) Pulse Rate:  [56-63] 56 (09/21 0730) Resp:  [17-18] 18 (09/21 0730) BP: (150-153)/(75-81) 151/81 mmHg (09/21 0730) SpO2:  [95 %-100 %] 100 % (09/21 0730) Last BM Date: 07/05/15  Intake/Output from previous day: 09/20 0701 - 09/21 0700 In: 1210 [P.O.:960; IV Piggyback:250] Out: -  Intake/Output this shift:    Lungs: CTAB  Cardiovascular: bradycardic  Abd: soft, NT, ND  Extremities: no edema  Neuro: AOx4  Anorectal: purulent drainage, no surrounding erythema, no areas of fluctuance on exam.  Lab Results: CBC   Recent Labs  07/05/15 0539 07/06/15 0510  WBC 20.8* 17.3*  HGB 13.6 12.9*  HCT 39.6 38.3*  PLT 354 314   BMET  Recent Labs  07/04/15 0548 07/05/15 0539 07/06/15 0510  NA 137  --   --   K 4.0  --   --   CL 106  --   --   CO2 25  --   --   GLUCOSE 87  --   --   BUN 31*  --   --   CREATININE 1.49* 1.15 1.18  CALCIUM 8.0*  --   --    PT/INR No results for input(s): LABPROT, INR in the last 72 hours. ABG No results for input(s): PHART, HCO3 in the last 72 hours.  Invalid input(s): PCO2, PO2  Studies/Results:  Anti-infectives: Anti-infectives    Start     Dose/Rate Route Frequency Ordered Stop   07/04/15 2000  vancomycin (VANCOCIN) IVPB 1000 mg/200 mL premix     1,000 mg 200 mL/hr over 60 Minutes Intravenous Every 12 hours 07/04/15 1949     07/02/15 2000  vancomycin (VANCOCIN) IVPB 1000 mg/200 mL premix  Status:  Discontinued     1,000 mg 200 mL/hr over 60 Minutes Intravenous Every 12 hours 07/02/15 1757 07/04/15 1207   07/01/15 0800  piperacillin-tazobactam (ZOSYN) IVPB 3.375 g     3.375 g 12.5 mL/hr over 240 Minutes Intravenous Every 8 hours 07/01/15 0751     07/01/15 0600  vancomycin  (VANCOCIN) IVPB 1000 mg/200 mL premix  Status:  Discontinued     1,000 mg 200 mL/hr over 60 Minutes Intravenous Every 12 hours 06/30/15 2002 07/02/15 1413   06/30/15 1830  vancomycin (VANCOCIN) IVPB 1000 mg/200 mL premix     1,000 mg 200 mL/hr over 60 Minutes Intravenous STAT 06/30/15 1816 06/30/15 2035      Medications: Scheduled Meds: . heparin  5,000 Units Subcutaneous 3 times per day  . piperacillin-tazobactam (ZOSYN)  IV  3.375 g Intravenous Q8H  . polyethylene glycol  17 g Oral Q24H  . vancomycin  1,000 mg Intravenous Q12H   Continuous Infusions:  PRN Meds:.acetaminophen **OR** acetaminophen, fentaNYL (SUBLIMAZE) injection, HYDROcodone-acetaminophen, HYDROmorphone (DILAUDID) injection, iohexol, ondansetron **OR** ondansetron (ZOFRAN) IV, pantoprazole sodium, promethazine  Assessment/Plan: Patient Active Problem List   Diagnosis Date Noted  . Perirectal abscess-complex 07/01/2015   s/p Procedure(s): INCISION AND DRAINAGE PERIRECTAL  ABSCESS 07/01/2015  Continue abx WBC improving, continue wound care   LOS: 5 days   Rodman Pickle, MD Pg# [phone] [facility]

## 2015-07-07 LAB — CBC
HEMATOCRIT: 41.2 % (ref 39.0–52.0)
HEMOGLOBIN: 13.6 g/dL (ref 13.0–17.0)
MCH: 29.2 pg (ref 26.0–34.0)
MCHC: 33 g/dL (ref 30.0–36.0)
MCV: 88.6 fL (ref 78.0–100.0)
Platelets: 380 10*3/uL (ref 150–400)
RBC: 4.65 MIL/uL (ref 4.22–5.81)
RDW: 15.4 % (ref 11.5–15.5)
WBC: 14.5 10*3/uL — AB (ref 4.0–10.5)

## 2015-07-07 MED ORDER — AMOXICILLIN-POT CLAVULANATE 875-125 MG PO TABS
1.0000 | ORAL_TABLET | Freq: Two times a day (BID) | ORAL | Status: DC
  Administered 2015-07-07 – 2015-07-08 (×3): 1 via ORAL
  Filled 2015-07-07 (×4): qty 1

## 2015-07-07 NOTE — Discharge Summary (Signed)
Physician Discharge Summary  Patient ID: Joseph Villarreal MRN: [id number] DOB/AGE: [birth year] 59 y.o.  Admit date: 06/30/2015 Discharge date: 07/08/2015  Admission Diagnoses:  Left buttocks cellulitis Mild renal insuffiencey  Prior history  of rectal fissure treated in the past at [facility]   Discharge Diagnoses:  Horse shoe perirectal abscess Mild renal insuffiencey - resolved NSAID use Hx of rectal fissure  Principal Problem:   Perirectal abscess-complex Active Problems:   Acute renal insufficiency   History of rectal fissure   PROCEDURES: Incision and drainage of horseshoe perirectal abscess, placement of 2 penrose drains and packing of all 3 incisions, 07/01/15, Dr. Sandie Ano Course:  patient is a 59 yo WM with 4 day hx of pain in left buttock. No drainage. No pain with normal BM's. Low grade fever with sweats, chills. Seen today at [facility] and referred to ER for evaluation. WBC 27.2K. CT scan: Moderate volume subcutaneous soft tissue air involving the posterior perineum and perirectal soft tissues. There is no rim enhancing fluid collection to suggest abscess. Findings raise concern for Fournier's gangrene. He was seen the following AM by Dr. Daphine Deutscher and taken to the OR later that AM.  He found a large abscess posteriorly and crossing the midline, going into the right side.  He placed penrose drains and packed sites after drainage of the wounds.  Post op he has made slow progress.  He has been maintained on IV antibiotics.  His WBC went up and we repeated the CT scan on 07/05/15.  It showed marked improvement, with on 2.7 x 1.5 cm fluid /gas collection right ischiorectal fossa.  He had some mild renal insuffiencey on admit that has improved, his WBC continues to improve.  He has been switched to oral antibiotics, and if he does well we anticipate him going home on 07/08/15.  He will keep his penrose drains and will see Dr. Daphine Deutscher in the office the  following week.  He does have a history of rectal fissure, followed some years ago at [facility].  I also recommended he obtain a regular family practice doctor to monitor his health on a regular basis.  I told him he could return to work when he felt comfortable.  CBC Latest Ref Rng 07/08/2015 07/07/2015 07/06/2015  WBC 4.0 - 10.5 K/uL 11.9(H) 14.5(H) 17.3(H)  Hemoglobin 13.0 - 17.0 g/dL 62.3 76.2 12.9(L)  Hematocrit 39.0 - 52.0 % 40.6 41.2 38.3(L)  Platelets 150 - 400 K/uL 379 380 314   CMP Latest Ref Rng 07/08/2015 07/06/2015 07/05/2015  Glucose 65 - 99 mg/dL 831(D) - -  BUN 6 - 20 mg/dL 17(O) - -  Creatinine 1.60 - 1.24 mg/dL 7.37 1.06 2.69  Sodium 135 - 145 mmol/L 137 - -  Potassium 3.5 - 5.1 mmol/L 4.2 - -  Chloride 101 - 111 mmol/L 106 - -  CO2 22 - 32 mmol/L 25 - -  Calcium 8.9 - 10.3 mg/dL 4.8(N) - -    Creatinine was 1.42 on Admission  Disposition: Home     Medication List    STOP taking these medications        ibuprofen 200 MG tablet  Commonly known as:  ADVIL,MOTRIN      TAKE these medications        acetaminophen 325 MG tablet  Commonly known as:  TYLENOL  Take 2 tablets (650 mg total) by mouth every 6 (six) hours as needed for mild pain (or temp > 100).  amoxicillin-clavulanate 875-125 MG per tablet  Commonly known as:  AUGMENTIN  Take 1 tablet by mouth every 12 (twelve) hours.     aspirin 81 MG tablet  Take 81 mg by mouth daily.     esomeprazole 10 MG packet  Commonly known as:  NEXIUM  Take 10 mg by mouth daily as needed (acid reflux).     HYDROcodone-acetaminophen 5-325 MG per tablet  Commonly known as:  NORCO/VICODIN  Take 1-2 tablets by mouth every 4 (four) hours as needed for moderate pain.     meloxicam 15 MG tablet  Commonly known as:  MOBIC  Take 15 mg by mouth daily.     pseudoephedrine 30 MG tablet  Commonly known as:  SUDAFED  Take 60 mg by mouth every 4 (four) hours as needed for congestion.     saccharomyces boulardii 250 MG  capsule  Commonly known as:  FLORASTOR  You can buy this over the counter at any drug store.  Use for the next 2 weeks.       Follow-up Information    Follow up with Valarie Merino, MD On 07/13/2015.   Specialty:  General Surgery   Why:  Your appointment is at 3:45 PM, be at the office 30 minutes early for check in.   Contact information:   [address] [place] [address] [phone]       Signed: Sherrie George 07/08/2015, 10:50 AM

## 2015-07-08 ENCOUNTER — Encounter (HOSPITAL_COMMUNITY): Payer: Self-pay | Admitting: General Surgery

## 2015-07-08 DIAGNOSIS — Z8719 Personal history of other diseases of the digestive system: Secondary | ICD-10-CM

## 2015-07-08 DIAGNOSIS — N289 Disorder of kidney and ureter, unspecified: Secondary | ICD-10-CM

## 2015-07-08 HISTORY — DX: Personal history of other diseases of the digestive system: Z87.19

## 2015-07-08 LAB — CBC
HEMATOCRIT: 40.6 % (ref 39.0–52.0)
Hemoglobin: 13.3 g/dL (ref 13.0–17.0)
MCH: 29.1 pg (ref 26.0–34.0)
MCHC: 32.8 g/dL (ref 30.0–36.0)
MCV: 88.8 fL (ref 78.0–100.0)
PLATELETS: 379 10*3/uL (ref 150–400)
RBC: 4.57 MIL/uL (ref 4.22–5.81)
RDW: 15.5 % (ref 11.5–15.5)
WBC: 11.9 10*3/uL — AB (ref 4.0–10.5)

## 2015-07-08 LAB — BASIC METABOLIC PANEL
ANION GAP: 6 (ref 5–15)
BUN: 21 mg/dL — ABNORMAL HIGH (ref 6–20)
CALCIUM: 8.5 mg/dL — AB (ref 8.9–10.3)
CO2: 25 mmol/L (ref 22–32)
CREATININE: 1.17 mg/dL (ref 0.61–1.24)
Chloride: 106 mmol/L (ref 101–111)
Glucose, Bld: 113 mg/dL — ABNORMAL HIGH (ref 65–99)
Potassium: 4.2 mmol/L (ref 3.5–5.1)
SODIUM: 137 mmol/L (ref 135–145)

## 2015-07-08 MED ORDER — SACCHAROMYCES BOULARDII 250 MG PO CAPS: ORAL_CAPSULE | ORAL | Status: DC

## 2015-07-08 MED ORDER — AMOXICILLIN-POT CLAVULANATE 875-125 MG PO TABS: 1.0000 | ORAL_TABLET | Freq: Two times a day (BID) | ORAL | Status: DC

## 2015-07-08 MED ORDER — ACETAMINOPHEN 325 MG PO TABS: 650.0000 mg | ORAL_TABLET | Freq: Four times a day (QID) | ORAL | Status: DC | PRN

## 2015-07-08 MED ORDER — HYDROCODONE-ACETAMINOPHEN 5-325 MG PO TABS: 1.0000 | ORAL_TABLET | ORAL | Status: DC | PRN

## 2015-07-08 NOTE — Discharge Instructions (Signed)
Peri-Rectal Abscess  Your caregiver has diagnosed you as having a peri-rectal abscess. This is an infected area near the rectum that is filled with pus. If the abscess is near the surface of the skin, your caregiver may open (incise) the area and drain the pus.  HOME CARE INSTRUCTIONS    If your abscess was opened up and drained. A small piece of gauze may be placed in the opening so that it can drain. Do not remove the gauze unless directed by your caregiver.   A loose dressing may be placed over the abscess site. Change the dressing as often as necessary to keep it clean and dry.   After the drain is removed, the area may be washed with a gentle antiseptic (soap) four times per day.   A warm sitz bath, warm packs or heating pad may be used for pain relief, taking care not to burn yourself.   Return for a wound check in 1 day or as directed.   An "inflatable doughnut" may be used for sitting with added comfort. These can be purchased at a drugstore or medical supply house.   To reduce pain and straining with bowel movements, eat a high fiber diet with plenty of fruits and vegetables. Use stool softeners as recommended by your caregiver. This is especially important if narcotic type pain medications were prescribed as these may cause marked constipation.   Only take over-the-counter or prescription medicines for pain, discomfort, or fever as directed by your caregiver.  SEEK IMMEDIATE MEDICAL CARE IF:    You have increasing pain that is not controlled by medication.   There is increased inflammation (redness), swelling, bleeding, or drainage from the area.   An oral temperature above 102 F (38.9 C) develops.   You develop chills or generalized malaise (feel lethargic or feel "washed out").   You develop any new symptoms (problems) you feel may be related to your present problem.  Document Released: 09/28/2000 Document Revised: 12/24/2011 Document Reviewed: 09/28/2008  [employer] Patient Information  2015 [employer]. This information is not intended to replace advice given to you by your health care provider. Make sure you discuss any questions you have with your health care provider.

## 2015-10-04 ENCOUNTER — Other Ambulatory Visit: Payer: Self-pay | Admitting: General Surgery

## 2015-10-04 DIAGNOSIS — K603 Anal fistula: Secondary | ICD-10-CM

## 2015-10-12 ENCOUNTER — Ambulatory Visit
Admission: RE | Admit: 2015-10-12 | Discharge: 2015-10-12 | Disposition: A | Discharge status: Home / Self Care | Payer: BLUE CROSS/BLUE SHIELD | Source: Ambulatory Visit | Attending: General Surgery | Admitting: General Surgery

## 2015-10-12 DIAGNOSIS — K603 Anal fistula: Secondary | ICD-10-CM

## 2015-10-12 MED ORDER — GADOBENATE DIMEGLUMINE 529 MG/ML IV SOLN
17.0000 mL | Freq: Once | INTRAVENOUS | Status: AC | PRN
  Administered 2015-10-12: 17 mL via INTRAVENOUS

## 2016-02-01 ENCOUNTER — Encounter: Payer: Self-pay | Admitting: Gastroenterology

## 2016-02-28 ENCOUNTER — Other Ambulatory Visit: Payer: Self-pay | Admitting: General Surgery

## 2016-02-28 NOTE — H&P (Signed)
History of Present Illness Joseph Villarreal([name] MD; 02/28/2016 12:23 PM) The patient is a 60 year old male who presents with a complaint of Fistula. 60yo M s/p hospitalization for a horseshow abscess. His incisions are healed and he has no more drainage from his L lateral incision. He was last seen approximately 2 weeks ago placed on antibiotics due to some pressure and pain sensation like his previous symptoms. He developed spontaneous drainage approximately one week ago that alleviated his symptoms. He has now completed his antibiotics and is having no further issues.  Hx: He saw Dr Wayne SeverWatters and at that time and was given the choice between surgery and watching the area. He decided to watch the area. 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  Diagnostic Studies History Joseph Villarreal(Joseph Balli, MD; 02/28/2016 12:23 PM) Colonoscopy 5-10 years ago  Allergies Joseph Villarreal(Joseph Villarreal, CMA; 02/28/2016 11:33 AM) No Known Drug Allergies 07/13/2015  Medication History Joseph Villarreal(Joseph Villarreal, CMA; 02/28/2016 11:33 AM) ZyrTEC (10MG  Tablet, Oral) Active. Afrin Saline Nasal Mist (0.65% Solution, Nasal) Active. Mobic (15MG  Tablet, Oral) Active. Aspirin (81MG  Tablet Chewable, Oral) Active. Florastor (250MG  Capsule, Oral) Active. NexIUM (20MG  Capsule DR, Oral) Active. Medications Reconciled  Social History Joseph Villarreal(Joseph Lonigro, MD; 02/28/2016 12:23 PM) Alcohol use Occasional alcohol use. Caffeine use Carbonated beverages, Coffee, Tea. No drug use Tobacco  use Former smoker.  Family History Joseph Villarreal(Joseph Groom, MD; 02/28/2016 12:23 PM) Heart Disease Father. Heart disease in male family member before age 61 Kidney Disease Father. Hypertension Mother.     Review of Systems Joseph Villarreal(Joseph Boehner MD; 02/28/2016 12:23 PM) General Present- Chills and Night Sweats. Not Present- Appetite Loss, Fatigue, Fever, Weight Gain and Weight Loss. Skin Not Present- Change in Wart/Mole, Dryness, Hives, Jaundice, New Lesions, Non-Healing Wounds, Rash and Ulcer. HEENT Present- Wears glasses/contact lenses. Not Present- Earache, Hearing Loss, Hoarseness, Nose Bleed, Oral Ulcers, Ringing in the Ears, Seasonal Allergies, Sinus Pain, Sore Throat, Visual Disturbances and Yellow Eyes. Respiratory Present- Snoring. Not Present- Bloody sputum, Chronic Cough, Difficulty Breathing and Wheezing. Breast Not Present- Breast Mass, Breast Pain, Nipple Discharge and Skin Changes. Cardiovascular Not Present- Chest Pain, Difficulty Breathing Lying Down, Leg Cramps, Palpitations, Rapid Heart Rate, Shortness of Breath and Swelling of Extremities. Gastrointestinal Present- Hemorrhoids. Not Present- Abdominal Pain, Bloating, Bloody Stool, Change in Bowel Habits, Chronic diarrhea, Constipation, Difficulty Swallowing, Excessive gas, Gets full quickly at meals, Indigestion, Nausea, Rectal Pain and Vomiting. Male Genitourinary Not Present- Blood in Urine, Change in Urinary Stream, Frequency, Impotence, Nocturia, Painful Urination, Urgency and Urine Leakage. Musculoskeletal Not Present- Back Pain, Joint Pain, Joint Stiffness, Muscle Pain, Muscle Weakness and Swelling of Extremities. Neurological Not Present- Decreased Memory, Fainting, Headaches, Numbness, Seizures, Tingling, Tremor, Trouble walking and Weakness. Psychiatric Not Present- Anxiety, Bipolar, Change in Sleep Pattern, Depression, Fearful and Frequent crying. Endocrine Not Present- Cold Intolerance, Excessive Hunger, Hair Changes, Heat  Intolerance and New Diabetes. Hematology Not Present- Easy Bruising, Excessive bleeding, Gland problems, HIV and Persistent Infections.  Vitals Joseph Villarreal(Joseph Villarreal CMA; 02/28/2016 11:34 AM) 02/28/2016 11:33 AM Weight: 198 lb Height: 70in Body Surface Area: 2.08 m Body Mass Index: 28.41 kg/m  Temp.: 97.68F  Pulse:  71 (Regular)  BP: 136/86 (Sitting, Left Arm, Standard)      Physical Exam Joseph Levee MD; 02/28/2016 12:25 PM)  General Mental Status-Alert. General Appearance-Not in acute distress. Build & Nutrition-Well nourished. Posture-Normal posture. Gait-Normal.  Head and Neck Head-normocephalic, atraumatic with no lesions or palpable masses. Trachea-midline.  Chest and Lung Exam Chest and lung exam reveals -on auscultation, normal breath sounds, no adventitious sounds and normal vocal resonance.  Cardiovascular Cardiovascular examination reveals -normal heart sounds, regular rate and rhythm with no murmurs.  Abdomen Inspection Inspection of the abdomen reveals - No Hernias. Palpation/Percussion Palpation and Percussion of the abdomen reveal - Soft, Non Tender, No Rigidity (guarding), No hepatosplenomegaly and No Palpable abdominal masses.  Rectal Anorectal Exam External - Note: Purulent straining from internal opening, edema noted at the left lateral incision.  Neurologic Neurologic evaluation reveals -alert and oriented x 3 with no impairment of recent or remote memory, normal attention span and ability to concentrate, normal sensation and normal coordination.  Musculoskeletal Normal Exam - Bilateral-Upper Extremity Strength Normal and Lower Extremity Strength Normal.    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 Drainage occurred through the left lateral incision  site. He continues to have internal drainage noted on physical exam. I have recommended an exam under anesthesia with probable seton placement. Hopefully we can get enough information about this to determine what the next treatment option would be.

## 2016-03-19 ENCOUNTER — Encounter (HOSPITAL_BASED_OUTPATIENT_CLINIC_OR_DEPARTMENT_OTHER): Payer: Self-pay | Admitting: *Deleted

## 2016-03-19 NOTE — Progress Notes (Signed)
NPO AFTER MN.  ARRIVE AT 0930.  NEEDS HG. WILL TAKE AM MEDS W/ SIPS OF WATER .

## 2016-03-22 ENCOUNTER — Ambulatory Visit (HOSPITAL_BASED_OUTPATIENT_CLINIC_OR_DEPARTMENT_OTHER): Payer: Managed Care, Other (non HMO) | Admitting: Anesthesiology

## 2016-03-22 ENCOUNTER — Ambulatory Visit (HOSPITAL_BASED_OUTPATIENT_CLINIC_OR_DEPARTMENT_OTHER)
Admission: RE | Admit: 2016-03-22 | Discharge: 2016-03-22 | Disposition: A | Discharge status: Home / Self Care | Payer: Managed Care, Other (non HMO) | Source: Ambulatory Visit | Attending: General Surgery | Admitting: General Surgery

## 2016-03-22 ENCOUNTER — Encounter (HOSPITAL_BASED_OUTPATIENT_CLINIC_OR_DEPARTMENT_OTHER): Payer: Self-pay | Admitting: Anesthesiology

## 2016-03-22 ENCOUNTER — Encounter (HOSPITAL_BASED_OUTPATIENT_CLINIC_OR_DEPARTMENT_OTHER)
Admission: RE | Disposition: A | Discharge status: Home / Self Care | Payer: Self-pay | Source: Ambulatory Visit | Attending: General Surgery

## 2016-03-22 DIAGNOSIS — Z79899 Other long term (current) drug therapy: Secondary | ICD-10-CM | POA: Insufficient documentation

## 2016-03-22 DIAGNOSIS — K603 Anal fistula: Secondary | ICD-10-CM | POA: Insufficient documentation

## 2016-03-22 DIAGNOSIS — Z791 Long term (current) use of non-steroidal anti-inflammatories (NSAID): Secondary | ICD-10-CM | POA: Diagnosis not present

## 2016-03-22 DIAGNOSIS — K219 Gastro-esophageal reflux disease without esophagitis: Secondary | ICD-10-CM | POA: Insufficient documentation

## 2016-03-22 DIAGNOSIS — Z7982 Long term (current) use of aspirin: Secondary | ICD-10-CM | POA: Diagnosis not present

## 2016-03-22 DIAGNOSIS — Z87891 Personal history of nicotine dependence: Secondary | ICD-10-CM | POA: Insufficient documentation

## 2016-03-22 HISTORY — PX: RECTAL EXAM UNDER ANESTHESIA: SHX6399

## 2016-03-22 HISTORY — DX: Allergic rhinitis, unspecified: J30.9

## 2016-03-22 HISTORY — DX: Gastro-esophageal reflux disease without esophagitis: K21.9

## 2016-03-22 HISTORY — DX: Anal fissure, unspecified: K60.2

## 2016-03-22 HISTORY — DX: Presence of spectacles and contact lenses: Z97.3

## 2016-03-22 HISTORY — DX: Unspecified osteoarthritis, unspecified site: M19.90

## 2016-03-22 SURGERY — EXAM UNDER ANESTHESIA, RECTUM
Anesthesia: Monitor Anesthesia Care | Site: Anus

## 2016-03-22 MED ORDER — FENTANYL CITRATE (PF) 100 MCG/2ML IJ SOLN
INTRAMUSCULAR | Status: DC | PRN
  Administered 2016-03-22: 50 ug via INTRAVENOUS

## 2016-03-22 MED ORDER — DEXAMETHASONE SODIUM PHOSPHATE 10 MG/ML IJ SOLN
INTRAMUSCULAR | Status: AC
  Filled 2016-03-22: qty 1

## 2016-03-22 MED ORDER — KETOROLAC TROMETHAMINE 30 MG/ML IJ SOLN
INTRAMUSCULAR | Status: AC
  Filled 2016-03-22: qty 1

## 2016-03-22 MED ORDER — HEMOSTATIC AGENTS (NO CHARGE) OPTIME
TOPICAL | Status: DC | PRN
  Administered 2016-03-22: 1 via TOPICAL

## 2016-03-22 MED ORDER — ONDANSETRON HCL 4 MG/2ML IJ SOLN
INTRAMUSCULAR | Status: AC
  Filled 2016-03-22: qty 2

## 2016-03-22 MED ORDER — LIDOCAINE HCL (CARDIAC) 20 MG/ML IV SOLN
INTRAVENOUS | Status: AC
  Filled 2016-03-22: qty 5

## 2016-03-22 MED ORDER — LACTATED RINGERS IV SOLN
INTRAVENOUS | Status: DC
  Administered 2016-03-22: 10:00:00 via INTRAVENOUS
  Filled 2016-03-22: qty 1000

## 2016-03-22 MED ORDER — PROPOFOL 10 MG/ML IV BOLUS
INTRAVENOUS | Status: DC | PRN
  Administered 2016-03-22: 40 mg via INTRAVENOUS

## 2016-03-22 MED ORDER — PROPOFOL 500 MG/50ML IV EMUL
INTRAVENOUS | Status: DC | PRN
  Administered 2016-03-22: 50 ug/kg/min via INTRAVENOUS

## 2016-03-22 MED ORDER — BUPIVACAINE-EPINEPHRINE 0.5% -1:200000 IJ SOLN
INTRAMUSCULAR | Status: DC | PRN
  Administered 2016-03-22: 30 mL

## 2016-03-22 MED ORDER — LIDOCAINE 5 % EX OINT
TOPICAL_OINTMENT | CUTANEOUS | Status: DC | PRN
  Administered 2016-03-22: 1

## 2016-03-22 MED ORDER — FENTANYL CITRATE (PF) 100 MCG/2ML IJ SOLN
INTRAMUSCULAR | Status: AC
  Filled 2016-03-22: qty 2

## 2016-03-22 MED ORDER — MEPERIDINE HCL 25 MG/ML IJ SOLN
6.2500 mg | INTRAMUSCULAR | Status: DC | PRN
  Filled 2016-03-22: qty 1

## 2016-03-22 MED ORDER — ONDANSETRON HCL 4 MG/2ML IJ SOLN
INTRAMUSCULAR | Status: DC | PRN
  Administered 2016-03-22: 4 mg via INTRAVENOUS

## 2016-03-22 MED ORDER — HYDROMORPHONE HCL 1 MG/ML IJ SOLN
0.2500 mg | INTRAMUSCULAR | Status: DC | PRN
  Filled 2016-03-22: qty 1

## 2016-03-22 MED ORDER — ONDANSETRON HCL 4 MG/2ML IJ SOLN
4.0000 mg | Freq: Once | INTRAMUSCULAR | Status: DC | PRN
  Filled 2016-03-22: qty 2

## 2016-03-22 MED ORDER — OXYCODONE HCL 5 MG PO TABS: 5.0000 mg | ORAL_TABLET | ORAL | Status: AC | PRN

## 2016-03-22 MED ORDER — LIDOCAINE HCL (CARDIAC) 20 MG/ML IV SOLN
INTRAVENOUS | Status: DC | PRN
  Administered 2016-03-22: 100 mg via INTRAVENOUS

## 2016-03-22 MED ORDER — KETOROLAC TROMETHAMINE 30 MG/ML IJ SOLN
INTRAMUSCULAR | Status: DC | PRN
  Administered 2016-03-22: 30 mg via INTRAVENOUS

## 2016-03-22 MED ORDER — MIDAZOLAM HCL 2 MG/2ML IJ SOLN
INTRAMUSCULAR | Status: AC
  Filled 2016-03-22: qty 4

## 2016-03-22 MED ORDER — DEXAMETHASONE SODIUM PHOSPHATE 4 MG/ML IJ SOLN
INTRAMUSCULAR | Status: DC | PRN
  Administered 2016-03-22: 10 mg via INTRAVENOUS

## 2016-03-22 MED ORDER — MIDAZOLAM HCL 5 MG/5ML IJ SOLN
INTRAMUSCULAR | Status: DC | PRN
  Administered 2016-03-22 (×2): 2 mg via INTRAVENOUS

## 2016-03-22 SURGICAL SUPPLY — 60 items
BENZOIN TINCTURE PRP APPL 2/3 (GAUZE/BANDAGES/DRESSINGS) ×3 IMPLANT
BLADE HEX COATED 2.75 (ELECTRODE) ×3 IMPLANT
BLADE SURG 10 STRL SS (BLADE) ×3 IMPLANT
BLADE SURG 15 STRL LF DISP TIS (BLADE) IMPLANT
BLADE SURG 15 STRL SS (BLADE)
BNDG GAUZE ELAST 4 BULKY (GAUZE/BANDAGES/DRESSINGS) ×3 IMPLANT
BRIEF STRETCH FOR OB PAD LRG (UNDERPADS AND DIAPERS) ×3 IMPLANT
CANISTER SUCTION 2500CC (MISCELLANEOUS) ×3 IMPLANT
COVER BACK TABLE 60X90IN (DRAPES) ×3 IMPLANT
COVER MAYO STAND STRL (DRAPES) ×3 IMPLANT
DECANTER SPIKE VIAL GLASS SM (MISCELLANEOUS) ×3 IMPLANT
DRAPE LAPAROTOMY 100X72 PEDS (DRAPES) ×3 IMPLANT
DRAPE LG THREE QUARTER DISP (DRAPES) ×3 IMPLANT
DRAPE UNDERBUTTOCKS STRL (DRAPE) IMPLANT
DRAPE UTILITY XL STRL (DRAPES) ×3 IMPLANT
ELECT BLADE 6.5 .24CM SHAFT (ELECTRODE) IMPLANT
ELECT REM PT RETURN 9FT ADLT (ELECTROSURGICAL) ×3
ELECTRODE REM PT RTRN 9FT ADLT (ELECTROSURGICAL) ×1 IMPLANT
GAUZE SPONGE 4X4 16PLY XRAY LF (GAUZE/BANDAGES/DRESSINGS) IMPLANT
GAUZE VASELINE 3X9 (GAUZE/BANDAGES/DRESSINGS) IMPLANT
GLOVE BIO SURGEON STRL SZ 6.5 (GLOVE) ×6 IMPLANT
GLOVE BIO SURGEONS STRL SZ 6.5 (GLOVE) ×3
GLOVE BIOGEL PI IND STRL 6.5 (GLOVE) ×1 IMPLANT
GLOVE BIOGEL PI IND STRL 7.5 (GLOVE) ×1 IMPLANT
GLOVE BIOGEL PI INDICATOR 6.5 (GLOVE) ×2
GLOVE BIOGEL PI INDICATOR 7.5 (GLOVE) ×2
GLOVE INDICATOR 7.0 STRL GRN (GLOVE) ×6 IMPLANT
GOWN STRL REUS W/ TWL LRG LVL3 (GOWN DISPOSABLE) ×1 IMPLANT
GOWN STRL REUS W/ TWL XL LVL3 (GOWN DISPOSABLE) ×1 IMPLANT
GOWN STRL REUS W/TWL LRG LVL3 (GOWN DISPOSABLE) ×2
GOWN STRL REUS W/TWL XL LVL3 (GOWN DISPOSABLE) ×2
KIT ROOM TURNOVER WOR (KITS) ×3 IMPLANT
LEGGING LITHOTOMY PAIR STRL (DRAPES) IMPLANT
LOOP VESSEL MAXI BLUE (MISCELLANEOUS) IMPLANT
MANIFOLD NEPTUNE II (INSTRUMENTS) IMPLANT
NDL SAFETY ECLIPSE 18X1.5 (NEEDLE) IMPLANT
NEEDLE HYPO 18GX1.5 SHARP (NEEDLE)
NEEDLE HYPO 25X1 1.5 SAFETY (NEEDLE) ×3 IMPLANT
NS IRRIG 500ML POUR BTL (IV SOLUTION) ×3 IMPLANT
PACK BASIN DAY SURGERY FS (CUSTOM PROCEDURE TRAY) ×3 IMPLANT
PAD ABD 8X10 STRL (GAUZE/BANDAGES/DRESSINGS) IMPLANT
PAD ARMBOARD 7.5X6 YLW CONV (MISCELLANEOUS) IMPLANT
PENCIL BUTTON HOLSTER BLD 10FT (ELECTRODE) ×3 IMPLANT
SPONGE GAUZE 4X4 12PLY (GAUZE/BANDAGES/DRESSINGS) IMPLANT
SPONGE GAUZE 4X4 12PLY STER LF (GAUZE/BANDAGES/DRESSINGS) ×3 IMPLANT
SPONGE HEMORRHOID 8X3CM (HEMOSTASIS) ×3 IMPLANT
SPONGE SURGIFOAM ABS GEL 12-7 (HEMOSTASIS) IMPLANT
SUT CHROMIC 2 0 SH (SUTURE) ×3 IMPLANT
SUT ETHIBOND 0 (SUTURE) IMPLANT
SUT GUT CHROMIC 3 0 (SUTURE) IMPLANT
SUT MON AB 3-0 SH 27 (SUTURE) ×2
SUT MON AB 3-0 SH27 (SUTURE) ×1 IMPLANT
SUT VIC AB 4-0 P-3 18XBRD (SUTURE) IMPLANT
SUT VIC AB 4-0 P3 18 (SUTURE)
SYR CONTROL 10ML LL (SYRINGE) ×3 IMPLANT
TOWEL OR 17X24 6PK STRL BLUE (TOWEL DISPOSABLE) ×3 IMPLANT
TRAY DSU PREP LF (CUSTOM PROCEDURE TRAY) ×3 IMPLANT
TUBE CONNECTING 12'X1/4 (SUCTIONS) ×1
TUBE CONNECTING 12X1/4 (SUCTIONS) ×2 IMPLANT
YANKAUER SUCT BULB TIP NO VENT (SUCTIONS) ×3 IMPLANT

## 2016-03-22 NOTE — Anesthesia Procedure Notes (Signed)
Procedure Name: MAC Performed by: Shawnte Winton T Oxygen Delivery Method: Nasal cannula Placement Confirmation: positive ETCO2     

## 2016-03-22 NOTE — Discharge Instructions (Addendum)

## 2016-03-22 NOTE — Op Note (Signed)
03/22/2016  11:04 AM  PATIENT:  Joseph Villarreal  60 y.o. male  No care team member to display  PRE-OPERATIVE DIAGNOSIS:  Horseshoe fistula   POST-OPERATIVE DIAGNOSIS:  Posterior anal fistula   PROCEDURE:   ANAL EXAM UNDER ANESTHESIA  with FISTULOTOMY  SURGEON:  Surgeon(s): Romie LeveeAlicia Aquan Kope, MD  ASSISTANT: none   ANESTHESIA:   local and MAC  SPECIMEN:  No Specimen  DISPOSITION OF SPECIMEN:  N/A  COUNTS:  YES  PLAN OF CARE: Discharge to home after PACU  PATIENT DISPOSITION:  PACU - hemodynamically stable.  INDICATION: 60 y.o. M with chronic, recurring abscesses.  It was thought that he had a horse show abscess in the past.   OR FINDINGS: Superficial posterior midline fistula  DESCRIPTION: the patient was identified in the preoperative holding area and taken to the OR where they were laid on the operating room table.  MAC anesthesia was induced without difficulty. The patient was then positioned in prone jackknife position with buttocks gently taped apart.  The patient was then prepped and draped in usual sterile fashion.  SCDs were noted to be in place prior to the initiation of anesthesia. A surgical timeout was performed indicating the correct patient, procedure, positioning and need for preoperative antibiotics.  A rectal block was performed using Marcaine with epinephrine.    I began with a digital rectal exam.  There was a small defect palpated at the distal anal canal in posterior midline.  There was no defect noted proximally in the anal canal at posterior midline.  I then placed a Hill-Ferguson anoscope into the anal canal and evaluated this completely.  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  I placed an S shaped fistula probe into the posterior midline external opening and this easily exited into the previously noted internal opening.  This only involved a small portion of internal sphincter, so I performed a fistulotomy.  I was able to then place the anoscope and  evaluate the entire anal canal.  There were no signs of a deeper fistula.  The left lateral tract was closed as well.  There were no remaining signs of a horse shoe fistula.  I used a 3-0 Chromic to marsupialize the edges to the fistula tract.  A gelfoam roll was placed for added hemostasis at the end of the case.  The patient was then awakened and sent to the PACU in stable condition.  All counts were correct per OR staff.

## 2016-03-22 NOTE — Transfer of Care (Signed)
Immediate Anesthesia Transfer of Care Note  Patient: Joseph Villarreal  Procedure(s) Performed: Procedure(s): ANAL EXAM UNDER ANESTHESIA  with fistulotomy (N/A)  Patient Location: PACU  Anesthesia Type:MAC  Level of Consciousness: awake, alert  and oriented  Airway & Oxygen Therapy: Patient Spontanous Breathing and Patient connected to nasal cannula oxygen  Post-op Assessment: Report given to RN  Post vital signs: Reviewed and stable  Last Vitals: 141/98, 71, 10, 98/% Filed Vitals:   03/22/16 0940  BP: 176/94  Pulse: 63  Temp: 36.8 C  Resp: 18    Last Pain: There were no vitals filed for this visit.    Patients Stated Pain Goal: 8 (03/22/16 1010)  Complications: No apparent anesthesia complications

## 2016-03-22 NOTE — Anesthesia Preprocedure Evaluation (Signed)
Anesthesia Evaluation  Patient identified by MRN, date of birth, ID band Patient awake    Reviewed: Allergy & Precautions, NPO status , Patient's Chart, lab work & pertinent test results  Airway Mallampati: I  TM Distance: >3 FB Neck ROM: Full    Dental   Pulmonary former smoker,    Pulmonary exam normal        Cardiovascular Normal cardiovascular exam     Neuro/Psych    GI/Hepatic GERD  Medicated and Controlled,  Endo/Other    Renal/GU      Musculoskeletal   Abdominal   Peds  Hematology   Anesthesia Other Findings   Reproductive/Obstetrics                             Anesthesia Physical Anesthesia Plan  ASA: II  Anesthesia Plan: General and MAC   Post-op Pain Management:    Induction: Intravenous  Airway Management Planned: Simple Face Mask  Additional Equipment:   Intra-op Plan:   Post-operative Plan:   Informed Consent: I have reviewed the patients History and Physical, chart, labs and discussed the procedure including the risks, benefits and alternatives for the proposed anesthesia with the patient or authorized representative who has indicated his/her understanding and acceptance.     Plan Discussed with: CRNA and Surgeon  Anesthesia Plan Comments:         Anesthesia Quick Evaluation

## 2016-03-22 NOTE — H&P (View-Only) (Signed)
History of Present Illness Romie Levee MD; 02/28/2016 12:23 PM) The patient is a 60 year old male who presents with a complaint of Fistula. 60yo M s/p hospitalization for a horseshow abscess. His incisions are healed and he has no more drainage from his L lateral incision. He was last seen approximately 2 weeks ago placed on antibiotics due to some pressure and pain sensation like his previous symptoms. He developed spontaneous drainage approximately one week ago that alleviated his symptoms. He has now completed his antibiotics and is having no further issues.  Hx: He saw Dr Wayne Sever and at that time and was given the choice between surgery and watching the area. He decided to watch the area. 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  Diagnostic Studies History Romie Levee, MD; 02/28/2016 12:23 PM) Colonoscopy 5-10 years ago  Allergies [name], CMA; 02/28/2016 11:33 AM) No Known Drug Allergies 07/13/2015  Medication History [name], CMA; 02/28/2016 11:33 AM) ZyrTEC (  Tablet, Oral) Active. Afrin Saline Nasal Mist (0.65% Solution, Nasal) Active. Mobic (  Tablet, Oral) Active. Aspirin (  Tablet Chewable, Oral) Active. Florastor (  Capsule, Oral) Active. NexIUM (  Capsule DR, Oral) Active. Medications Reconciled  Social History Romie Levee, MD; 02/28/2016 12:23 PM) Alcohol use Occasional alcohol use. Caffeine use Carbonated beverages, Coffee, Tea. No drug use Tobacco  use Former smoker.  Family History Romie Levee, MD; 02/28/2016 12:23 PM) Heart Disease Father. Heart disease in male family member before age 3 Kidney Disease Father. Hypertension Mother.     Review of Systems Romie Levee MD; 02/28/2016 12:23 PM) General Present- Chills and Night Sweats. Not Present- Appetite Loss, Fatigue, Fever, Weight Gain and Weight Loss. Skin Not Present- Change in Wart/Mole, Dryness, Hives, Jaundice, New Lesions, Non-Healing Wounds, Rash and Ulcer. HEENT Present- Wears glasses/contact lenses. Not Present- Earache, Hearing Loss, Hoarseness, Nose Bleed, Oral Ulcers, Ringing in the Ears, Seasonal Allergies, Sinus Pain, Sore Throat, Visual Disturbances and Yellow Eyes. Respiratory Present- Snoring. Not Present- Bloody sputum, Chronic Cough, Difficulty Breathing and Wheezing. Breast Not Present- Breast Mass, Breast Pain, Nipple Discharge and Skin Changes. Cardiovascular Not Present- Chest Pain, Difficulty Breathing Lying Down, Leg Cramps, Palpitations, Rapid Heart Rate, Shortness of Breath and Swelling of Extremities. Gastrointestinal Present- Hemorrhoids. Not Present- Abdominal Pain, Bloating, Bloody Stool, Change in Bowel Habits, Chronic diarrhea, Constipation, Difficulty Swallowing, Excessive gas, Gets full quickly at meals, Indigestion, Nausea, Rectal Pain and Vomiting. Male Genitourinary Not Present- Blood in Urine, Change in Urinary Stream, Frequency, Impotence, Nocturia, Painful Urination, Urgency and Urine Leakage. Musculoskeletal Not Present- Back Pain, Joint Pain, Joint Stiffness, Muscle Pain, Muscle Weakness and Swelling of Extremities. Neurological Not Present- Decreased Memory, Fainting, Headaches, Numbness, Seizures, Tingling, Tremor, Trouble walking and Weakness. Psychiatric Not Present- Anxiety, Bipolar, Change in Sleep Pattern, Depression, Fearful and Frequent crying. Endocrine Not Present- Cold Intolerance, Excessive Hunger, Hair Changes, Heat  Intolerance and New Diabetes. Hematology Not Present- Easy Bruising, Excessive bleeding, Gland problems, HIV and Persistent Infections.  Vitals [name] CMA; 02/28/2016 11:34 AM) 02/28/2016 11:33 AM Weight: 198 lb Height: 70in Body Surface Area: 2.08 m Body Mass Index: 28.41 kg/m  Temp.: 97.42F  Pulse:  71 (Regular)  BP: 136/86 (Sitting, Left Arm, Standard)      Physical Exam Romie Levee(Alaura Schippers MD; 02/28/2016 12:25 PM)  General Mental Status-Alert. General Appearance-Not in acute distress. Build & Nutrition-Well nourished. Posture-Normal posture. Gait-Normal.  Head and Neck Head-normocephalic, atraumatic with no lesions or palpable masses. Trachea-midline.  Chest and Lung Exam Chest and lung exam reveals -on auscultation, normal breath sounds, no adventitious sounds and normal vocal resonance.  Cardiovascular Cardiovascular examination reveals -normal heart sounds, regular rate and rhythm with no murmurs.  Abdomen Inspection Inspection of the abdomen reveals - No Hernias. Palpation/Percussion Palpation and Percussion of the abdomen reveal - Soft, Non Tender, No Rigidity (guarding), No hepatosplenomegaly and No Palpable abdominal masses.  Rectal Anorectal Exam External - Note: Purulent straining from internal opening, edema noted at the left lateral incision.  Neurologic Neurologic evaluation reveals -alert and oriented x 3 with no impairment of recent or remote memory, normal attention span and ability to concentrate, normal sensation and normal coordination.  Musculoskeletal Normal Exam - Bilateral-Upper Extremity Strength Normal and Lower Extremity Strength Normal.    Assessment & Plan Romie Levee(Maryuri Warnke MD; 02/28/2016 11:56 AM)  FISTULA-IN-ANO (K60.3) Impression: Patient with a history of a horseshoe fistula who presents to the office with a recent episode of abscess and drainage. Drainage occurred through the left lateral incision  site. He continues to have internal drainage noted on physical exam. I have recommended an exam under anesthesia with probable seton placement. Hopefully we can get enough information about this to determine what the next treatment option would be.

## 2016-03-22 NOTE — Anesthesia Postprocedure Evaluation (Signed)
Anesthesia Post Note  Patient: Joseph Villarreal  Procedure(s) Performed: Procedure(s) (LRB): ANAL EXAM UNDER ANESTHESIA  with fistulotomy (N/A)  Patient location during evaluation: PACU Anesthesia Type: MAC Level of consciousness: awake and alert Pain management: pain level controlled Vital Signs Assessment: post-procedure vital signs reviewed and stable Respiratory status: spontaneous breathing, nonlabored ventilation, respiratory function stable and patient connected to nasal cannula oxygen Cardiovascular status: stable and blood pressure returned to baseline Anesthetic complications: no    Last Vitals:  Filed Vitals:   03/22/16 1316 03/22/16 1330  BP: 186/102 174/90  Pulse: 51   Temp: 36.6 C   Resp: 16     Last Pain: There were no vitals filed for this visit.               Tonda Wiederhold DAVID

## 2016-03-22 NOTE — Interval H&P Note (Signed)
History and Physical Interval Note:  03/22/2016 10:09 AM  Joseph CoderJames M Byers  has presented today for surgery, with the diagnosis of Horseshoe fistula   The various methods of treatment have been discussed with the patient and family. After consideration of risks, benefits and other options for treatment, the patient has consented to  Procedure(s): ANAL EXAM UNDER ANESTHESIA POSSIBLE SETON PLACEMENT  (N/A) as a surgical intervention .  The patient's history has been reviewed, patient examined, no change in status, stable for surgery.  I have reviewed the patient's chart and labs.  Questions were answered to the patient's satisfaction.     Vanita PandaAlicia C Katina Remick, MD  Colorectal and General Surgery [facility]

## 2016-03-23 ENCOUNTER — Encounter (HOSPITAL_BASED_OUTPATIENT_CLINIC_OR_DEPARTMENT_OTHER): Payer: Self-pay | Admitting: General Surgery

## 2016-03-23 LAB — POCT HEMOGLOBIN-HEMACUE: HEMOGLOBIN: 16.3 g/dL (ref 13.0–17.0)

## 2017-02-19 IMAGING — CT CT PELVIS W/ CM
2 of 4 series · 17 of 46 positions shown, 19 images · IV contrast (OMNIPAQUE)
Comparison: Prior CT scan of the pelvis 06/30/2015

CLINICAL DATA: 59-year-old male postoperative day #4 status post
incision and drainage of perirectal abscess.

EXAM:
CT PELVIS WITH CONTRAST
TECHNIQUE: Multidetector CT imaging of the pelvis was performed using the
standard protocol following the bolus administration of intravenous
contrast.
CONTRAST:  100mL OMNIPAQUE IOHEXOL 300 MG/ML  SOLN

[Series 2: pelvis st · axial · 0.84mm/px · z∈[-726,-436]mm · 14 of 66 slices shown, 16 images]
[im 4/66  soft-tissue]
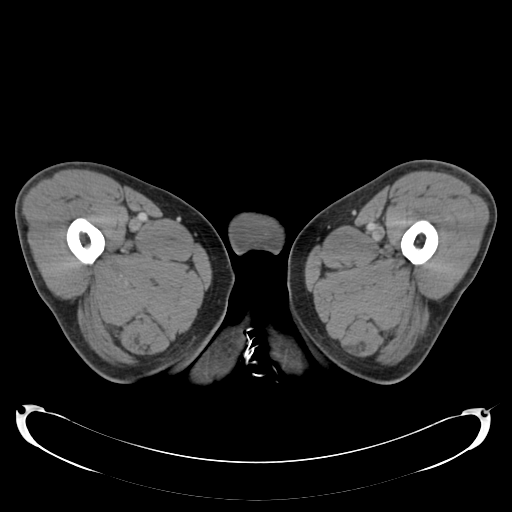
[im 4/66  bone]
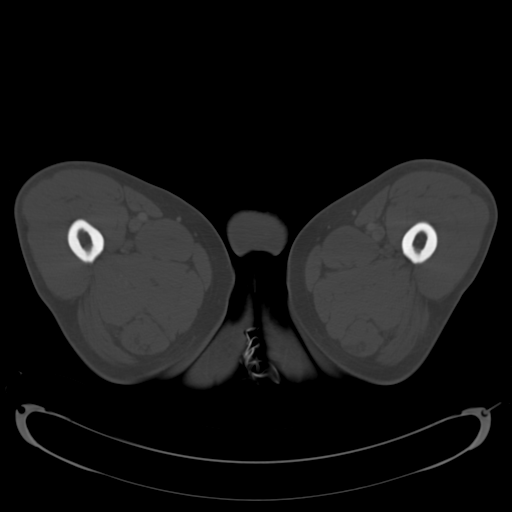
[im 10/66  soft-tissue]
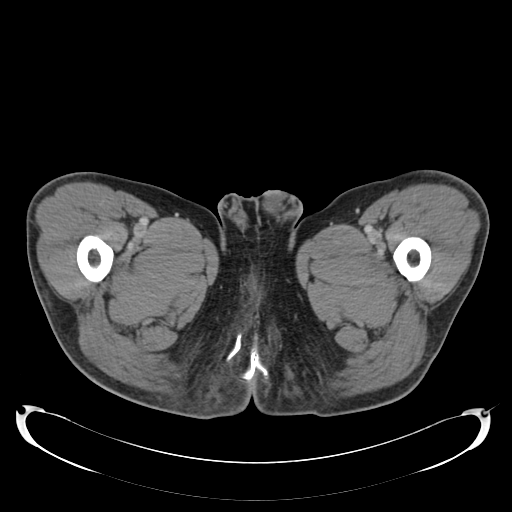
[im 14/66  soft-tissue]
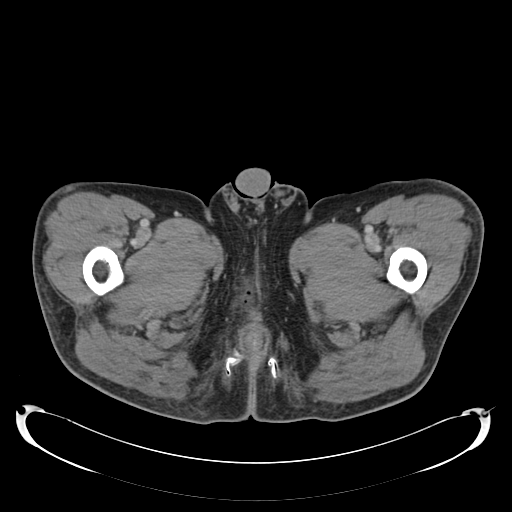
[im 17/66  soft-tissue]
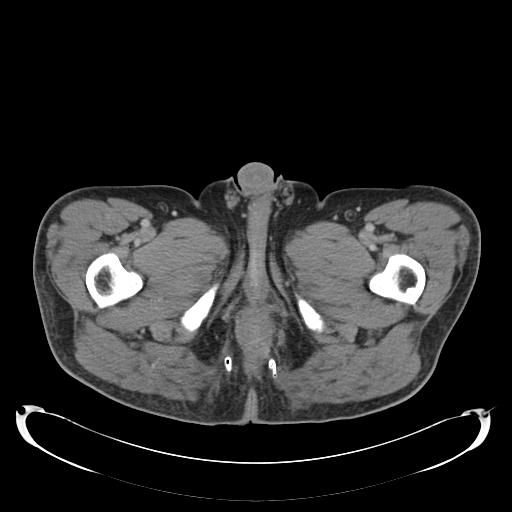
[im 23/66  soft-tissue]
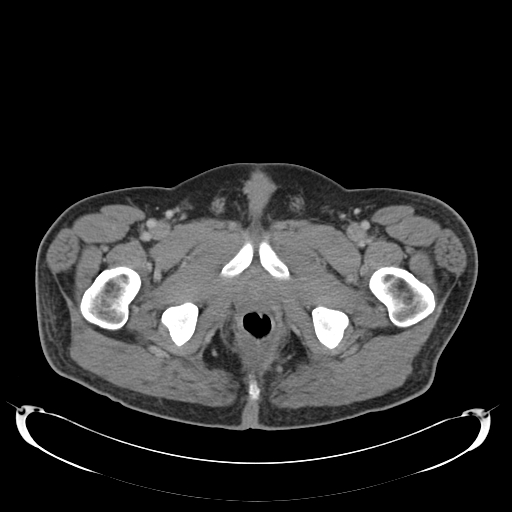
[im 27/66  soft-tissue]
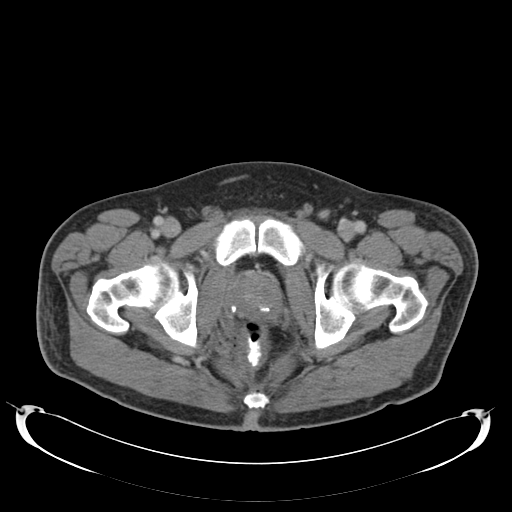
[im 30/66  soft-tissue]
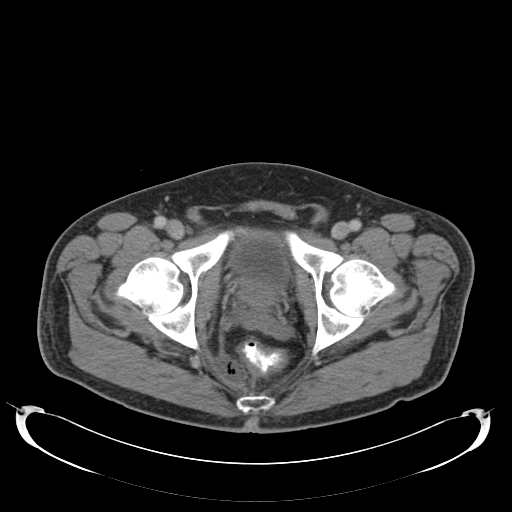
[im 36/66  soft-tissue]
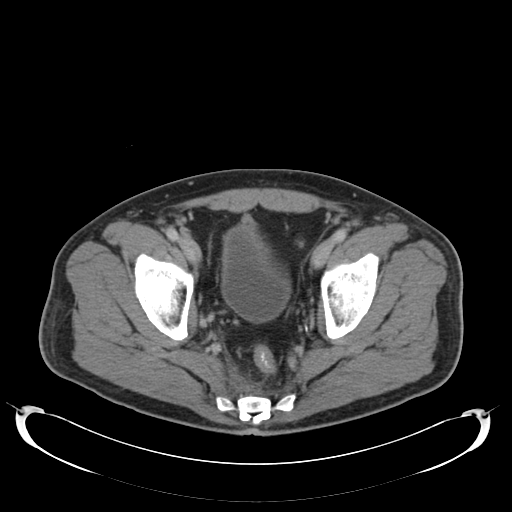
[im 40/66  soft-tissue]
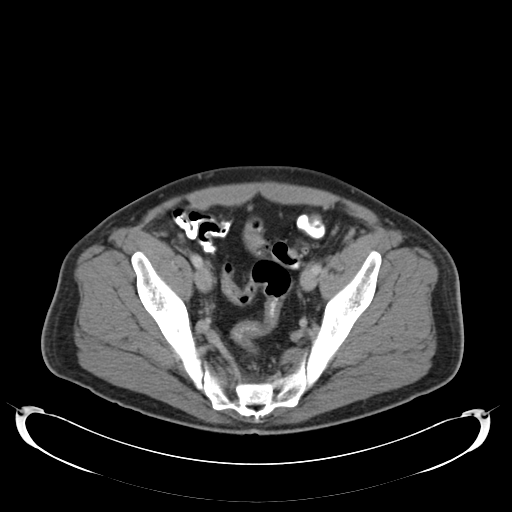
[im 40/66  bone]
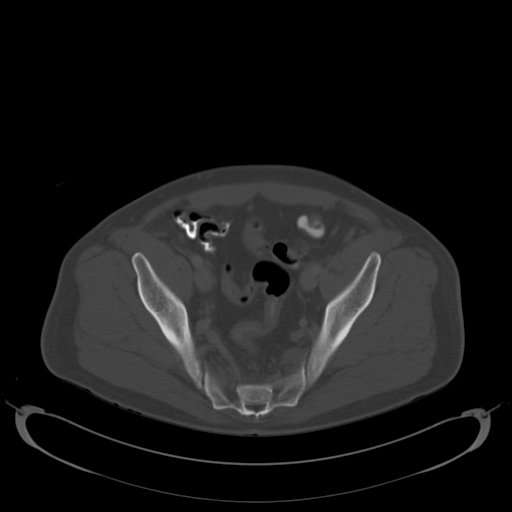
[im 43/66  soft-tissue]
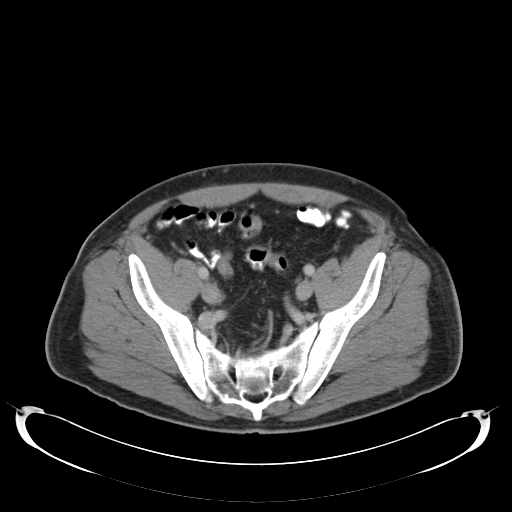
[im 49/66  soft-tissue]
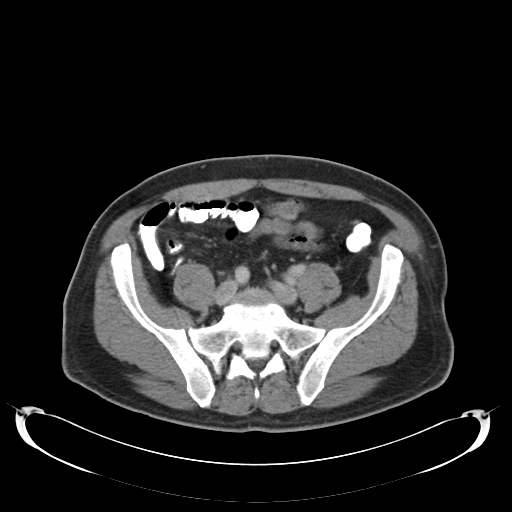
[im 53/66  soft-tissue]
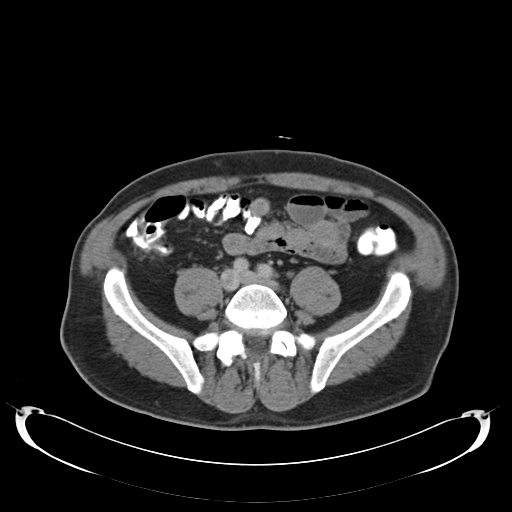
[im 56/66  soft-tissue]
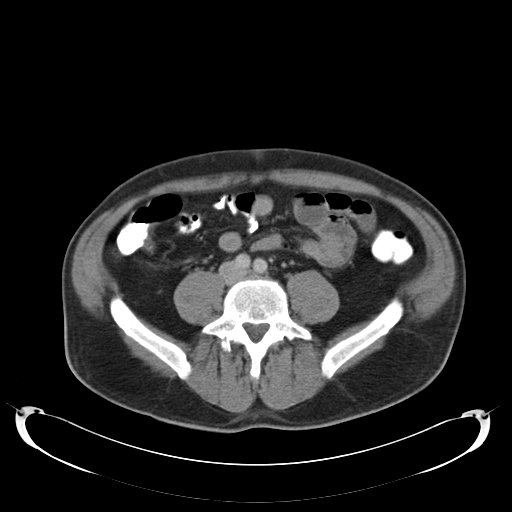
[im 62/66  soft-tissue]
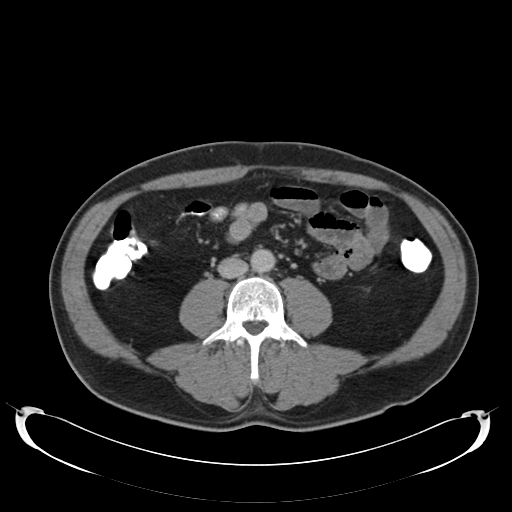

[Series 602: coronal · coronal · 0.84mm/px · 3 of 113 slices shown]
[im 38/113  soft-tissue]
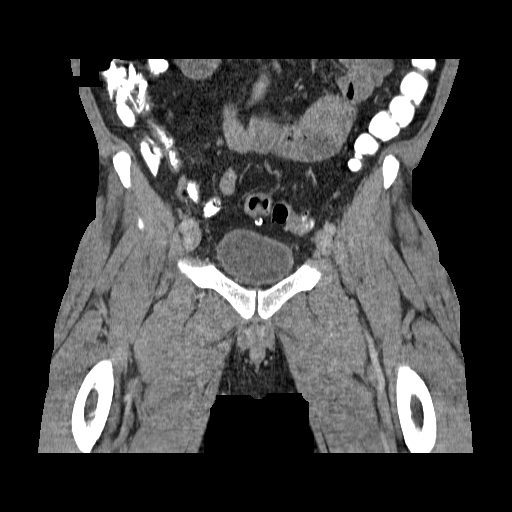
[im 50/113  soft-tissue]
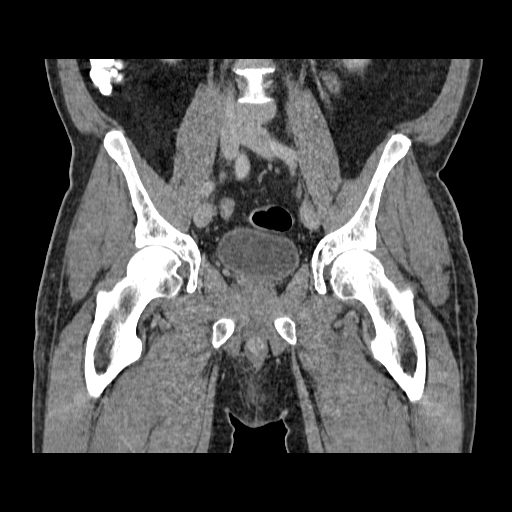
[im 63/113  soft-tissue]
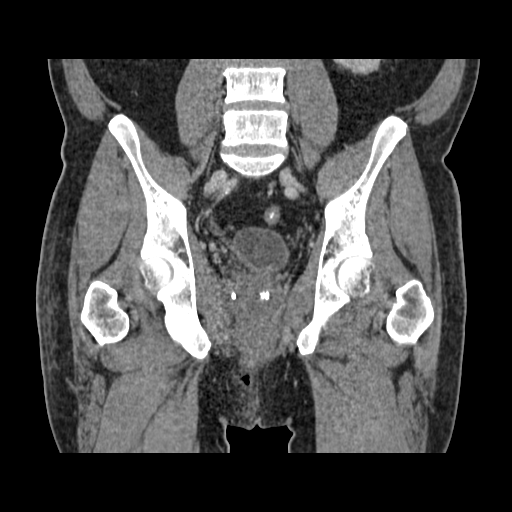

[17 of 46 positions shown; findings below may reference images not displayed]

FINDINGS: Small 2.7 x 1.5 cm fluid and gas collection to the right of the
rectum superior to the ischiorectal fossa. Other than this small
collection, there has been a significant decrease in the amount of
gas and fluid in the bilateral ischiorectal fossa and perianal soft
tissues. Two Didimo drains are in place. No evidence of
extravasation of contrast material which is present within the
rectum. No evidence of obstruction. The visualized intrapelvic
contents remain unremarkable. There is some persistent inflammatory
stranding throughout the subcutaneous fat of the bilateral medial
buttocks consistent with cellulitis.
IMPRESSION: 1. Significant improvement in the amount of the scattered
subcutaneous gas and fluid from the bilateral ischiorectal fossae
and perianal soft tissues with 2 Didimo drains in place.
2. Solitary residual 2.7 x 1.5 cm fluid and gas (predominantly gas)
collection to the right of the rectum superior to the right
ischiorectal fossa.
3. Persistent inflammatory stranding throughout the subcutaneous fat
of the bilateral medial buttocks consistent with residual
cellulitis.
